# Patient Record
Sex: Female | Born: 1975 | Hispanic: No | Marital: Single | Smoking: Never smoker
Health system: Southern US, Community
[De-identification: ages and names within clinical notes are randomized; demographics above are authoritative.]

## PROBLEM LIST (undated history)

## (undated) DIAGNOSIS — I10 Essential (primary) hypertension: Secondary | ICD-10-CM

## (undated) DIAGNOSIS — E119 Type 2 diabetes mellitus without complications: Secondary | ICD-10-CM

## (undated) HISTORY — PX: NO PAST SURGERIES: SHX2092

## (undated) HISTORY — DX: Type 2 diabetes mellitus without complications: E11.9

## (undated) HISTORY — DX: Essential (primary) hypertension: I10

---

## 2001-11-02 ENCOUNTER — Encounter: Admission: RE | Admit: 2001-11-02 | Discharge: 2001-11-02 | Payer: Self-pay | Admitting: *Deleted

## 2014-10-21 LAB — CYTOLOGY - PAP: PAP SMEAR: NEGATIVE

## 2016-04-04 DIAGNOSIS — E785 Hyperlipidemia, unspecified: Secondary | ICD-10-CM | POA: Insufficient documentation

## 2016-04-04 LAB — OB RESULTS CONSOLE HEPATITIS B SURFACE ANTIGEN: Hepatitis B Surface Ag: NEGATIVE

## 2016-04-04 LAB — OB RESULTS CONSOLE GC/CHLAMYDIA
Chlamydia: NEGATIVE
Gonorrhea: NEGATIVE

## 2016-04-04 LAB — OB RESULTS CONSOLE RPR: RPR: NONREACTIVE

## 2016-04-04 LAB — OB RESULTS CONSOLE VARICELLA ZOSTER ANTIBODY, IGG: VARICELLA IGG: IMMUNE

## 2016-04-04 LAB — OB RESULTS CONSOLE HIV ANTIBODY (ROUTINE TESTING): HIV: NONREACTIVE

## 2016-04-04 LAB — SICKLE CELL SCREEN: Sickle Cell Screen: NORMAL

## 2016-04-04 LAB — OB RESULTS CONSOLE ABO/RH: RH TYPE: POSITIVE

## 2016-04-04 LAB — OB RESULTS CONSOLE ANTIBODY SCREEN: Antibody Screen: NEGATIVE

## 2016-04-04 LAB — OB RESULTS CONSOLE RUBELLA ANTIBODY, IGM: RUBELLA: IMMUNE

## 2016-05-30 NOTE — L&D Delivery Note (Signed)
41 y.o. [id number] at 6072w0d delivered a viable female infant in cephalic, ROA position. No nuchal cord. Left anterior shoulder delivered with ease. 60 sec delayed cord clamping. Cord clamped x2 and cut. Placenta delivered spontaneously intact, with 3VC. Fundus firm on exam with massage and pitocin. Good hemostasis noted.  Anesthesia: Local anesthesia for repair Laceration: 2nd degree perineal Suture: 3-0 Vicryl Good hemostasis noted. EBL: 200cc  Mom and baby recovering in LDR.    Apgars: APGAR (1 MIN): 8   APGAR (5 MINS): 9    Weight: Pending skin to skin  Placenta sent to pathology.   Patient initially came in 4-5 hours previously for labor check, was found to be in active labor and have CHTN with SIPE with severe features 2/2 severe range BPs and elevated creatinine, PC ratio, and AST/ALT. No symptoms of HA/changes in vision/RUQ or epigastric pain. Good urine output during labor/delivery. Continue magnesium for 24 hours postpartum.   Jen MowElizabeth Shaely Gadberry, DO OB Fellow [facility] 10/02/2016, 1:58 AM

## 2016-07-11 ENCOUNTER — Other Ambulatory Visit: Payer: Self-pay

## 2016-07-11 LAB — OB RESULTS CONSOLE HGB/HCT, BLOOD
HCT: 34 %
Hemoglobin: 11.2 g/dL

## 2016-07-11 LAB — OB RESULTS CONSOLE PLATELET COUNT: PLATELETS: 237 10*3/uL

## 2016-07-15 ENCOUNTER — Encounter: Payer: Self-pay | Admitting: *Deleted

## 2016-07-18 ENCOUNTER — Ambulatory Visit: Payer: Self-pay | Admitting: *Deleted

## 2016-07-18 ENCOUNTER — Encounter: Payer: Self-pay | Attending: Obstetrics & Gynecology | Admitting: *Deleted

## 2016-07-18 DIAGNOSIS — O24419 Gestational diabetes mellitus in pregnancy, unspecified control: Secondary | ICD-10-CM

## 2016-07-18 DIAGNOSIS — Z713 Dietary counseling and surveillance: Secondary | ICD-10-CM | POA: Insufficient documentation

## 2016-07-18 DIAGNOSIS — Z6828 Body mass index (BMI) 28.0-28.9, adult: Secondary | ICD-10-CM | POA: Insufficient documentation

## 2016-07-18 DIAGNOSIS — Z3A00 Weeks of gestation of pregnancy not specified: Secondary | ICD-10-CM | POA: Insufficient documentation

## 2016-07-18 NOTE — Progress Notes (Signed)
  Patient was seen on 07/18/2016 for type 2 Diabetes with pregnancy . The following learning objectives were met by the patient : she works in a [employer] full time and walks most of her day. She is on Metformin and Glucotrol, does not state any historyof hypoglycemia yet. She brought her True Trak meter and log book. All FBG entries are below 95 mg/dl and most post meal BG are below 120 with an occasional BG of 135 or so. She states sometimes she checks at 1 hour instead of 2 hours due to work schedule.   States why dietary management is important in controlling blood glucose  Describes the effects of carbohydrates on blood glucose levels  Demonstrates ability to create a balanced meal plan  Demonstrates carbohydrate counting   States when to check blood glucose levels  Demonstrates proper blood glucose monitoring techniques  States the effect of stress and exercise on blood glucose levels  States the importance of limiting caffeine and abstaining from alcohol and smoking  States understanding that Glucotrol   Plan:  Aim for 3 Carb Choices per meal (45 grams) +/- 1 either way  Aim for 1-2 Carbs per snack Begin reading food labels for Total Carbohydrate of foods Continue with your activity level by walking or other activity daily as tolerated Continue checking BG before breakfast and 2 hours after first bite of breakfast, lunch and dinner as directed by MD If you test at 1 hour, make a note on the log book explaining Take medication as directed by MD  Patient already has a meter: True Trak And is testing pre breakfast and 1-2 hours each meal as directed by MD Review of Log Book shows: BG in good control at this time  Patient instructed to monitor glucose levels: FBS: 60 - <90 2 hour: <120 If checking at 1 hour after meal, identify in Log Book  Patient received the following handouts:  Nutrition Diabetes and Pregnancy  Carbohydrate Counting List  Patient will be seen  for follow-up as needed.

## 2016-07-25 ENCOUNTER — Ambulatory Visit (INDEPENDENT_AMBULATORY_CARE_PROVIDER_SITE_OTHER): Payer: Self-pay | Admitting: Obstetrics & Gynecology

## 2016-07-25 ENCOUNTER — Encounter: Payer: Self-pay | Admitting: Obstetrics & Gynecology

## 2016-07-25 DIAGNOSIS — O10013 Pre-existing essential hypertension complicating pregnancy, third trimester: Secondary | ICD-10-CM

## 2016-07-25 DIAGNOSIS — O09521 Supervision of elderly multigravida, first trimester: Secondary | ICD-10-CM | POA: Insufficient documentation

## 2016-07-25 DIAGNOSIS — O169 Unspecified maternal hypertension, unspecified trimester: Secondary | ICD-10-CM | POA: Insufficient documentation

## 2016-07-25 DIAGNOSIS — O09523 Supervision of elderly multigravida, third trimester: Secondary | ICD-10-CM

## 2016-07-25 DIAGNOSIS — Z23 Encounter for immunization: Secondary | ICD-10-CM

## 2016-07-25 DIAGNOSIS — O24113 Pre-existing diabetes mellitus, type 2, in pregnancy, third trimester: Secondary | ICD-10-CM

## 2016-07-25 DIAGNOSIS — O24919 Unspecified diabetes mellitus in pregnancy, unspecified trimester: Secondary | ICD-10-CM | POA: Insufficient documentation

## 2016-07-25 DIAGNOSIS — O0990 Supervision of high risk pregnancy, unspecified, unspecified trimester: Secondary | ICD-10-CM

## 2016-07-25 LAB — POCT URINALYSIS DIP (DEVICE)
BILIRUBIN URINE: NEGATIVE
Glucose, UA: NEGATIVE mg/dL
Hgb urine dipstick: NEGATIVE
Ketones, ur: NEGATIVE mg/dL
Nitrite: NEGATIVE
Protein, ur: NEGATIVE mg/dL
SPECIFIC GRAVITY, URINE: 1.02 (ref 1.005–1.030)
Urobilinogen, UA: 0.2 mg/dL (ref 0.0–1.0)
pH: 6.5 (ref 5.0–8.0)

## 2016-07-25 NOTE — Progress Notes (Signed)
  Subjective:    Sylvia Chase is a H [id number] 5974w1d being seen today for her first obstetrical visit.  Her obstetrical history is significant for A2DM, ghtn, language barrier, ama. Patient does intend to breast feed. Pregnancy history fully reviewed.  Patient reports no complaints.  Vitals:   07/25/16 0800 07/25/16 0800  BP: (!) 107/52   Pulse: 93   Weight: 148 lb 11.2 oz (67.4 kg)   Height:  5\' 1"  (1.549 m)    HISTORY: OB History  Gravida Para Term Preterm AB Living  5 3 3  0 1 3  SAB TAB Ectopic Multiple Live Births  1 0 0 0 3    # Outcome Date GA Lbr Len/2nd Weight Sex Delivery Anes PTL Lv  5 Current           4 SAB 2014          3 Term 01/16/99 6651w0d  7 lb 9 oz (3.43 kg) F Vag-Spont  N LIV  2 Term 10/10/93 4351w0d   F Vag-Spont  N LIV  1 Term 02/28/92 8051w0d  6 lb 9.8 oz (3 kg) M Vag-Spont  N LIV     Past Medical History:  Diagnosis Date  . Diabetes mellitus without complication (HCC)   . Hypertension    Past Surgical History:  Procedure Laterality Date  . NO PAST SURGERIES     Family History  Problem Relation Age of Onset  . Kidney disease Sister      Exam    Uterus:     Pelvic Exam:    Perineum: No Hemorrhoids   Vulva: normal   Vagina:  normal mucosa   pH:    Cervix: anteverted   Adnexa: normal adnexa   Bony Pelvis: android  System: Breast:  normal appearance, no masses or tenderness   Skin: normal coloration and turgor, no rashes    Neurologic: oriented   Extremities: normal strength, tone, and muscle mass   HEENT PERRLA   Mouth/Teeth mucous membranes moist, pharynx normal without lesions   Neck supple   Cardiovascular: regular rate and rhythm   Respiratory:  appears well, vitals normal, no respiratory distress, acyanotic, normal RR, ear and throat exam is normal, neck free of mass or lymphadenopathy, chest clear, no wheezing, crepitations, rhonchi, normal symmetric air entry   Abdomen: soft, non-tender; bowel sounds normal; no masses,  no  organomegaly   Urinary: urethral meatus normal      Assessment:    Pregnancy: [id number] Patient Active Problem List   Diagnosis Date Noted  . Supervision of high risk pregnancy, antepartum 07/25/2016  . Advanced maternal age in multigravida 07/25/2016  . Diabetes mellitus in pregnancy 07/25/2016  . Hypertension in pregnancy 07/25/2016        Plan:     Initial labs drawn. Prenatal vitamins. Problem list reviewed and updated.  Ultrasound discussed; fetal survey: ordered.  Follow up in 2 weeks. TDAP, and 28 week labs today Her recorded sugars are excellent.  Sylvia Chase 07/25/2016

## 2016-07-26 LAB — CBC
HEMOGLOBIN: 12.1 g/dL (ref 11.1–15.9)
Hematocrit: 36.4 % (ref 34.0–46.6)
MCH: 29.3 pg (ref 26.6–33.0)
MCHC: 33.2 g/dL (ref 31.5–35.7)
MCV: 88 fL (ref 79–97)
PLATELETS: 249 10*3/uL (ref 150–379)
RBC: 4.13 x10E6/uL (ref 3.77–5.28)
RDW: 15.7 % — AB (ref 12.3–15.4)
WBC: 11.6 10*3/uL — ABNORMAL HIGH (ref 3.4–10.8)

## 2016-07-26 LAB — RPR: RPR: NONREACTIVE

## 2016-07-26 LAB — HIV ANTIBODY (ROUTINE TESTING W REFLEX): HIV Screen 4th Generation wRfx: NONREACTIVE

## 2016-07-27 ENCOUNTER — Ambulatory Visit (HOSPITAL_COMMUNITY)
Admission: RE | Admit: 2016-07-27 | Discharge: 2016-07-27 | Disposition: A | Discharge status: Home / Self Care | Payer: Self-pay | Source: Ambulatory Visit | Attending: Obstetrics & Gynecology | Admitting: Obstetrics & Gynecology

## 2016-07-27 ENCOUNTER — Encounter (HOSPITAL_COMMUNITY): Payer: Self-pay

## 2016-07-27 ENCOUNTER — Other Ambulatory Visit: Payer: Self-pay | Admitting: Obstetrics & Gynecology

## 2016-07-27 DIAGNOSIS — O09522 Supervision of elderly multigravida, second trimester: Secondary | ICD-10-CM

## 2016-07-27 DIAGNOSIS — O24112 Pre-existing diabetes mellitus, type 2, in pregnancy, second trimester: Secondary | ICD-10-CM | POA: Insufficient documentation

## 2016-07-27 DIAGNOSIS — O24912 Unspecified diabetes mellitus in pregnancy, second trimester: Secondary | ICD-10-CM

## 2016-07-27 DIAGNOSIS — O162 Unspecified maternal hypertension, second trimester: Secondary | ICD-10-CM | POA: Insufficient documentation

## 2016-07-27 DIAGNOSIS — Z363 Encounter for antenatal screening for malformations: Secondary | ICD-10-CM

## 2016-07-27 DIAGNOSIS — Z3A27 27 weeks gestation of pregnancy: Secondary | ICD-10-CM

## 2016-07-27 DIAGNOSIS — O0990 Supervision of high risk pregnancy, unspecified, unspecified trimester: Secondary | ICD-10-CM

## 2016-07-28 ENCOUNTER — Other Ambulatory Visit (HOSPITAL_COMMUNITY): Payer: Self-pay | Admitting: *Deleted

## 2016-07-28 DIAGNOSIS — Z7984 Long term (current) use of oral hypoglycemic drugs: Principal | ICD-10-CM

## 2016-07-28 DIAGNOSIS — O24119 Pre-existing diabetes mellitus, type 2, in pregnancy, unspecified trimester: Secondary | ICD-10-CM

## 2016-08-08 ENCOUNTER — Ambulatory Visit (INDEPENDENT_AMBULATORY_CARE_PROVIDER_SITE_OTHER): Payer: Self-pay | Admitting: Obstetrics and Gynecology

## 2016-08-08 VITALS — BP 128/85 | HR 80 | Wt 151.1 lb

## 2016-08-08 DIAGNOSIS — O24113 Pre-existing diabetes mellitus, type 2, in pregnancy, third trimester: Secondary | ICD-10-CM

## 2016-08-08 DIAGNOSIS — O0990 Supervision of high risk pregnancy, unspecified, unspecified trimester: Secondary | ICD-10-CM

## 2016-08-08 DIAGNOSIS — O09523 Supervision of elderly multigravida, third trimester: Secondary | ICD-10-CM

## 2016-08-08 DIAGNOSIS — O10013 Pre-existing essential hypertension complicating pregnancy, third trimester: Secondary | ICD-10-CM

## 2016-08-08 LAB — POCT URINALYSIS DIP (DEVICE)
Bilirubin Urine: NEGATIVE
Glucose, UA: NEGATIVE mg/dL
HGB URINE DIPSTICK: NEGATIVE
Ketones, ur: NEGATIVE mg/dL
NITRITE: NEGATIVE
Protein, ur: NEGATIVE mg/dL
Specific Gravity, Urine: 1.02 (ref 1.005–1.030)
UROBILINOGEN UA: 0.2 mg/dL (ref 0.0–1.0)
pH: 7 (ref 5.0–8.0)

## 2016-08-08 NOTE — Progress Notes (Signed)
Subjective:  Sylvia Chase is a 41 y.o. [id number] at 45w1dbeing seen today for ongoing prenatal care.  She is currently monitored for the following issues for this high-risk pregnancy and has Supervision of high risk pregnancy, antepartum; Advanced maternal age in multigravida; Diabetes mellitus in pregnancy; and Hypertension in pregnancy on her problem list.  Patient reports no complaints.  Contractions: Not present. Vag. Bleeding: None.  Movement: Present. Denies leaking of fluid.   The following portions of the patient's history were reviewed and updated as appropriate: allergies, current medications, past family history, past medical history, past social history, past surgical history and problem list. Problem list updated.  Objective:   Vitals:   08/08/16 0748  BP: 128/85  Pulse: 80  Weight: 151 lb 1.6 oz (68.5 kg)    Fetal Status: Fetal Heart Rate (bpm): 117   Movement: Present     General:  Alert, oriented and cooperative. Patient is in no acute distress.  Skin: Skin is warm and dry. No rash noted.   Cardiovascular: Normal heart rate noted  Respiratory: Normal respiratory effort, no problems with respiration noted  Abdomen: Soft, gravid, appropriate for gestational age. Pain/Pressure: Present     Pelvic:  Cervical exam deferred        Extremities: Normal range of motion.  Edema: None  Mental Status: Normal mood and affect. Normal behavior. Normal judgment and thought content.   Urinalysis: Urine Protein: Negative Urine Glucose: Negative  Assessment and Plan:  Pregnancy: [id number] 280w1d1. Elderly multigravida in third trimester Started prenatal care to late genetics   2. Supervision of high risk pregnancy, antepartum Stable Continue with PNV  3. Pre-existing type 2 diabetes mellitus during pregnancy in third trimester BS in goal range for the most part a few elevated secondary to diet choices Continue with current management Growth scan completed EFW  78%,  follow scan later this month Fetal ECHO scheduled Start twice weekly testing with next OB visit 4. Pre-existing essential hypertension during pregnancy in third trimester BP stable without meds, continue with BASA - Comp Met (CMET) - Protein / creatinine ratio, urine  Preterm labor symptoms and general obstetric precautions including but not limited to vaginal bleeding, contractions, leaking of fluid and fetal movement were reviewed in detail with the patient. Please refer to After Visit Summary for other counseling recommendations.  Return in about 2 weeks (around 08/22/2016).   MiChancy MilroyMD

## 2016-08-08 NOTE — Patient Instructions (Signed)
Tercer trimestre de embarazo (Third Trimester of Pregnancy) El tercer trimestre comprende desde la semana29 hasta la semana42, es decir, desde el mes7 hasta el mes9. El tercer trimestre es un perodo en el que el feto crece rpidamente. Hacia el final del noveno mes, el feto mide alrededor de 20pulgadas (45cm) de largo y pesa entre 6 y 10 libras (2,700 y 4,500kg). CAMBIOS EN EL ORGANISMO Su organismo atraviesa por muchos cambios durante el embarazo, y estos varan de una mujer a otra.  Seguir aumentando de peso. Es de esperar que aumente entre 25 y 35libras (11 y 16kg) hacia el final del embarazo.  Podrn aparecer las primeras estras en las caderas, el abdomen y las mamas.  Puede tener necesidad de orinar con ms frecuencia porque el feto baja hacia la pelvis y ejerce presin sobre la vejiga.  Debido al embarazo podr sentir acidez estomacal con frecuencia.  Puede estar estreida, ya que ciertas hormonas enlentecen los movimientos de los msculos que empujan los desechos a travs de los intestinos.  Pueden aparecer hemorroides o abultarse e hincharse las venas (venas varicosas).  Puede sentir dolor plvico debido al aumento de peso y a que las hormonas del embarazo relajan las articulaciones entre los huesos de la pelvis. El dolor de espalda puede ser consecuencia de la sobrecarga de los msculos que soportan la postura.  Tal vez haya cambios en el cabello que pueden incluir su engrosamiento, crecimiento rpido y cambios en la textura. Adems, a algunas mujeres se les cae el cabello durante o despus del embarazo, o tienen el cabello seco o fino. Lo ms probable es que el cabello se le normalice despus del nacimiento del beb.  Las mamas seguirn creciendo y le dolern. A veces, puede haber una secrecin amarilla de las mamas llamada calostro.  El ombligo puede salir hacia afuera.  Puede sentir que le falta el aire debido a que se expande el tero.  Puede notar que el feto  "baja" o lo siente ms bajo, en el abdomen.  Puede tener una prdida de secrecin mucosa con sangre. Esto suele ocurrir en el trmino de unos pocos das a una semana antes de que comience el trabajo de parto.  El cuello del tero se vuelve delgado y blando (se borra) cerca de la fecha de parto. QU DEBE ESPERAR EN LOS EXMENES PRENATALES Le harn exmenes prenatales cada 2semanas hasta la semana36. A partir de ese momento le harn exmenes semanales. Durante una visita prenatal de rutina:  La pesarn para asegurarse de que usted y el feto estn creciendo normalmente.  Le tomarn la presin arterial.  Le medirn el abdomen para controlar el desarrollo del beb.  Se escucharn los latidos cardacos fetales.  Se evaluarn los resultados de los estudios solicitados en visitas anteriores.  Le revisarn el cuello del tero cuando est prxima la fecha de parto para controlar si este se ha borrado. Alrededor de la semana36, el mdico le revisar el cuello del tero. Al mismo tiempo, realizar un anlisis de las secreciones del tejido vaginal. Este examen es para determinar si hay un tipo de bacteria, estreptococo Grupo B. El mdico le explicar esto con ms detalle. El mdico puede preguntarle lo siguiente:  Cmo le gustara que fuera el parto.  Cmo se siente.  Si siente los movimientos del beb.  Si ha tenido sntomas anormales, como prdida de lquido, sangrado, dolores de cabeza intensos o clicos abdominales.  Si est consumiendo algn producto que contenga tabaco, como cigarrillos, tabaco de mascar y   cigarrillos electrnicos.  Si tiene alguna pregunta. Otros exmenes o estudios de deteccin que pueden realizarse durante el tercer trimestre incluyen lo siguiente:  Anlisis de sangre para controlar los niveles de hierro (anemia).  Controles fetales para determinar su salud, nivel de actividad y crecimiento. Si tiene alguna enfermedad o hay problemas durante el embarazo, le harn  estudios.  Prueba del VIH (virus de inmunodeficiencia humana). Si corre un riesgo alto, pueden realizarle una prueba de deteccin del VIH durante el tercer trimestre del embarazo. FALSO TRABAJO DE PARTO Es posible que sienta contracciones leves e irregulares que finalmente desaparecen. Se llaman contracciones de Braxton Hicks o falso trabajo de parto. Las contracciones pueden durar horas, das o incluso semanas, antes de que el verdadero trabajo de parto se inicie. Si las contracciones ocurren a intervalos regulares, se intensifican o se hacen dolorosas, lo mejor es que la revise el mdico. SIGNOS DE TRABAJO DE PARTO  Clicos de tipo menstrual.  Contracciones cada 5minutos o menos.  Contracciones que comienzan en la parte superior del tero y se extienden hacia abajo, a la zona inferior del abdomen y la espalda.  Sensacin de mayor presin en la pelvis o dolor de espalda.  Una secrecin de mucosidad acuosa o con sangre que sale de la vagina. Si tiene alguno de estos signos antes de la semana37 del embarazo, llame a su mdico de inmediato. Debe concurrir al hospital para que la controlen inmediatamente. INSTRUCCIONES PARA EL CUIDADO EN EL HOGAR  Evite fumar, consumir hierbas, beber alcohol y tomar frmacos que no le hayan recetado. Estas sustancias qumicas afectan la formacin y el desarrollo del beb.  No consuma ningn producto que contenga tabaco, lo que incluye cigarrillos, tabaco de mascar y cigarrillos electrnicos. Si necesita ayuda para dejar de fumar, consulte al mdico. Puede recibir asesoramiento y otro tipo de recursos para dejar de fumar.  Siga las indicaciones del mdico en relacin con el uso de medicamentos. Durante el embarazo, hay medicamentos que son seguros de tomar y otros que no.  Haga ejercicio solamente como se lo haya indicado el mdico. Sentir clicos uterinos es un buen signo para detener la actividad fsica.  Contine comiendo alimentos sanos con  regularidad.  Use un sostn que le brinde buen soporte si le duelen las mamas.  No se d baos de inmersin en agua caliente, baos turcos ni saunas.  Use el cinturn de seguridad en todo momento mientras conduce.  No coma carne cruda ni queso sin cocinar; evite el contacto con las bandejas sanitarias de los gatos y la tierra que estos animales usan. Estos elementos contienen grmenes que pueden causar defectos congnitos en el beb.  Tome las vitaminas prenatales.  Tome entre 1500 y 2000mg de calcio diariamente comenzando en la semana20 del embarazo hasta el parto.  Si est estreida, pruebe un laxante suave (si el mdico lo autoriza). Consuma ms alimentos ricos en fibra, como vegetales y frutas frescos y cereales integrales. Beba gran cantidad de lquido para mantener la orina de tono claro o color amarillo plido.  Dese baos de asiento con agua tibia para aliviar el dolor o las molestias causadas por las hemorroides. Use una crema para las hemorroides si el mdico la autoriza.  Si tiene venas varicosas, use medias de descanso. Eleve los pies durante 15minutos, 3 o 4veces por da. Limite el consumo de sal en su dieta.  Evite levantar objetos pesados, use zapatos de tacones bajos y mantenga una buena postura.  Descanse con las piernas elevadas si tiene   calambres o dolor de cintura.  Visite a su dentista si no lo ha hecho durante el embarazo. Use un cepillo de dientes blando para higienizarse los dientes y psese el hilo dental con suavidad.  Puede seguir manteniendo relaciones sexuales, a menos que el mdico le indique lo contrario.  No haga viajes largos excepto que sea absolutamente necesario y solo con la autorizacin del mdico.  Tome clases prenatales para entender, practicar y hacer preguntas sobre el trabajo de parto y el parto.  Haga un ensayo de la partida al hospital.  Prepare el bolso que llevar al hospital.  Prepare la habitacin del beb.  Concurra a todas  las visitas prenatales segn las indicaciones de su mdico.  SOLICITE ATENCIN MDICA SI:  No est segura de que est en trabajo de parto o de que ha roto la bolsa de las aguas.  Tiene mareos.  Siente clicos leves, presin en la pelvis o dolor persistente en el abdomen.  Tiene nuseas, vmitos o diarrea persistentes.  Observa una secrecin vaginal con mal olor.  Siente dolor al orinar.  SOLICITE ATENCIN MDICA DE INMEDIATO SI:  Tiene fiebre.  Tiene una prdida de lquido por la vagina.  Tiene sangrado o pequeas prdidas vaginales.  Siente dolor intenso o clicos en el abdomen.  Sube o baja de peso rpidamente.  Tiene dificultad para respirar y siente dolor de pecho.  Sbitamente se le hinchan mucho el rostro, las manos, los tobillos, los pies o las piernas.  No ha sentido los movimientos del beb durante una hora.  Siente un dolor de cabeza intenso que no se alivia con medicamentos.  Su visin se modifica.  Esta informacin no tiene como fin reemplazar el consejo del mdico. Asegrese de hacerle al mdico cualquier pregunta que tenga. Document Released: 02/23/2005 Document Revised: 06/06/2014 Document Reviewed: 07/17/2012 Elsevier Interactive Patient Education  2017 [employer] Inc[employer]  

## 2016-08-09 LAB — COMPREHENSIVE METABOLIC PANEL
ALK PHOS: 68 IU/L (ref 39–117)
ALT: 12 IU/L (ref 0–32)
AST: 15 IU/L (ref 0–40)
Albumin/Globulin Ratio: 1.2 (ref 1.2–2.2)
Albumin: 3.2 g/dL — ABNORMAL LOW (ref 3.5–5.5)
BUN/Creatinine Ratio: 15 (ref 9–23)
BUN: 11 mg/dL (ref 6–24)
CHLORIDE: 102 mmol/L (ref 96–106)
CO2: 17 mmol/L — AB (ref 18–29)
CREATININE: 0.75 mg/dL (ref 0.57–1.00)
Calcium: 9 mg/dL (ref 8.7–10.2)
GFR calc non Af Amer: 100 mL/min/{1.73_m2} (ref >59)
GFR, EST AFRICAN AMERICAN: 115 mL/min/{1.73_m2} (ref >59)
GLUCOSE: 116 mg/dL — AB (ref 65–99)
Globulin, Total: 2.7 g/dL (ref 1.5–4.5)
Potassium: 4.1 mmol/L (ref 3.5–5.2)
Sodium: 137 mmol/L (ref 134–144)
TOTAL PROTEIN: 5.9 g/dL — AB (ref 6.0–8.5)

## 2016-08-09 LAB — PROTEIN / CREATININE RATIO, URINE
CREATININE, UR: 83.2 mg/dL
PROTEIN/CREAT RATIO: 119 mg/g{creat} (ref 0–200)
Protein, Ur: 9.9 mg/dL

## 2016-08-22 ENCOUNTER — Ambulatory Visit (INDEPENDENT_AMBULATORY_CARE_PROVIDER_SITE_OTHER): Payer: Self-pay | Admitting: Obstetrics & Gynecology

## 2016-08-22 VITALS — BP 112/76 | HR 65 | Wt 152.3 lb

## 2016-08-22 DIAGNOSIS — O0990 Supervision of high risk pregnancy, unspecified, unspecified trimester: Secondary | ICD-10-CM

## 2016-08-22 DIAGNOSIS — O24113 Pre-existing diabetes mellitus, type 2, in pregnancy, third trimester: Secondary | ICD-10-CM

## 2016-08-22 DIAGNOSIS — O09523 Supervision of elderly multigravida, third trimester: Secondary | ICD-10-CM

## 2016-08-22 DIAGNOSIS — O10013 Pre-existing essential hypertension complicating pregnancy, third trimester: Secondary | ICD-10-CM

## 2016-08-22 LAB — POCT URINALYSIS DIP (DEVICE)
Bilirubin Urine: NEGATIVE
GLUCOSE, UA: NEGATIVE mg/dL
Hgb urine dipstick: NEGATIVE
Ketones, ur: NEGATIVE mg/dL
NITRITE: NEGATIVE
PROTEIN: 30 mg/dL — AB
Specific Gravity, Urine: 1.025 (ref 1.005–1.030)
UROBILINOGEN UA: 0.2 mg/dL (ref 0.0–1.0)
pH: 6.5 (ref 5.0–8.0)

## 2016-08-22 NOTE — Progress Notes (Signed)
   PRENATAL VISIT NOTE  Subjective:  Sylvia Chase is a 41 y.o. [id number] at 9680w1d being seen today for ongoing prenatal care.  She is currently monitored for the following issues for this high-risk pregnancy and has Supervision of high risk pregnancy, antepartum; Advanced maternal age in multigravida; Diabetes mellitus in pregnancy; and Hypertension in pregnancy on her problem list. Patient is Spanish-speaking only, Spanish interpreter present for this encounter.  Patient reports no complaints.  Contractions: Not present. Vag. Bleeding: None.  Movement: Present. Denies leaking of fluid.   The following portions of the patient's history were reviewed and updated as appropriate: allergies, current medications, past family history, past medical history, past social history, past surgical history and problem list. Problem list updated.  Objective:   Vitals:   08/22/16 0751  BP: 112/76  Pulse: 65  Weight: 152 lb 4.8 oz (69.1 kg)    Fetal Status: Fetal Heart Rate (bpm): 120 Fundal Height: 31 cm Movement: Present     General:  Alert, oriented and cooperative. Patient is in no acute distress.  Skin: Skin is warm and dry. No rash noted.   Cardiovascular: Normal heart rate noted  Respiratory: Normal respiratory effort, no problems with respiration noted  Abdomen: Soft, gravid, appropriate for gestational age. Pain/Pressure: Absent     Pelvic:  Cervical exam deferred        Extremities: Normal range of motion.  Edema: Trace  Mental Status: Normal mood and affect. Normal behavior. Normal judgment and thought content.   Assessment and Plan:  Pregnancy: [id number] at 5780w1d  1. Pre-existing type 2 diabetes mellitus during pregnancy in third trimester Stable BS on Glucotrol and Metformin.  Growth scan this week. Fetal ECHO pending. Will start 2x/week testing next week  2. Pre-existing essential hypertension during pregnancy in third trimester BP stable.  Antenatal testing to start next  week.   3. Elderly multigravida in third trimester Antenatal testing to start next week.   4. Supervision of high risk pregnancy, antepartum Preterm labor symptoms and general obstetric precautions including but not limited to vaginal bleeding, contractions, leaking of fluid and fetal movement were reviewed in detail with the patient. Please refer to After Visit Summary for other counseling recommendations.  Return in about 1 week (around 08/29/2016) for NST, OB Visit (HOB).   Tereso NewcomerUgonna A Socorro Ebron, MD

## 2016-08-22 NOTE — Patient Instructions (Signed)
Regrese a la clinica cuando tenga su cita. Si tiene problemas o preguntas, llama a la clinica o vaya a la sala de emergencia al [facility].    

## 2016-08-24 ENCOUNTER — Ambulatory Visit (HOSPITAL_COMMUNITY)
Admission: RE | Admit: 2016-08-24 | Discharge: 2016-08-24 | Disposition: A | Discharge status: Home / Self Care | Payer: Self-pay | Source: Ambulatory Visit | Attending: Obstetrics & Gynecology | Admitting: Obstetrics & Gynecology

## 2016-08-24 ENCOUNTER — Encounter (HOSPITAL_COMMUNITY): Payer: Self-pay

## 2016-08-24 DIAGNOSIS — Z7984 Long term (current) use of oral hypoglycemic drugs: Secondary | ICD-10-CM

## 2016-08-24 DIAGNOSIS — Z3A31 31 weeks gestation of pregnancy: Secondary | ICD-10-CM | POA: Insufficient documentation

## 2016-08-24 DIAGNOSIS — O09523 Supervision of elderly multigravida, third trimester: Secondary | ICD-10-CM | POA: Insufficient documentation

## 2016-08-24 DIAGNOSIS — O163 Unspecified maternal hypertension, third trimester: Secondary | ICD-10-CM | POA: Insufficient documentation

## 2016-08-24 DIAGNOSIS — O24119 Pre-existing diabetes mellitus, type 2, in pregnancy, unspecified trimester: Secondary | ICD-10-CM

## 2016-08-24 DIAGNOSIS — O24113 Pre-existing diabetes mellitus, type 2, in pregnancy, third trimester: Secondary | ICD-10-CM | POA: Insufficient documentation

## 2016-08-25 ENCOUNTER — Other Ambulatory Visit: Payer: Self-pay | Admitting: Obstetrics & Gynecology

## 2016-08-25 ENCOUNTER — Other Ambulatory Visit (HOSPITAL_COMMUNITY): Payer: Self-pay | Admitting: *Deleted

## 2016-08-25 DIAGNOSIS — Z7984 Long term (current) use of oral hypoglycemic drugs: Principal | ICD-10-CM

## 2016-08-25 DIAGNOSIS — O24319 Unspecified pre-existing diabetes mellitus in pregnancy, unspecified trimester: Secondary | ICD-10-CM

## 2016-08-29 ENCOUNTER — Ambulatory Visit (INDEPENDENT_AMBULATORY_CARE_PROVIDER_SITE_OTHER): Payer: Self-pay | Admitting: Obstetrics and Gynecology

## 2016-08-29 VITALS — BP 119/79 | HR 75 | Wt 155.6 lb

## 2016-08-29 DIAGNOSIS — O24113 Pre-existing diabetes mellitus, type 2, in pregnancy, third trimester: Secondary | ICD-10-CM

## 2016-08-29 DIAGNOSIS — O10013 Pre-existing essential hypertension complicating pregnancy, third trimester: Secondary | ICD-10-CM

## 2016-08-29 DIAGNOSIS — Z789 Other specified health status: Secondary | ICD-10-CM

## 2016-08-29 DIAGNOSIS — Z603 Acculturation difficulty: Secondary | ICD-10-CM | POA: Insufficient documentation

## 2016-08-29 DIAGNOSIS — O0990 Supervision of high risk pregnancy, unspecified, unspecified trimester: Secondary | ICD-10-CM

## 2016-08-29 DIAGNOSIS — O09523 Supervision of elderly multigravida, third trimester: Secondary | ICD-10-CM

## 2016-08-29 NOTE — Progress Notes (Signed)
Prenatal Visit Note Date: 08/29/2016 Clinic: [facility]  Subjective:  Sylvia Chase is a 41 y.o. [phone] at [redacted]w[redacted]d being seen today for ongoing prenatal care.  She is currently monitored for the following issues for this high-risk pregnancy and has Supervision of high risk pregnancy, antepartum; Advanced maternal age in multigravida; Diabetes mellitus in pregnancy; and Hypertension in pregnancy on her problem list.  Patient reports no complaints.   Contractions: Not present. Vag. Bleeding: None.  Movement: Present. Denies leaking of fluid.   The following portions of the patient's history were reviewed and updated as appropriate: allergies, current medications, past family history, past medical history, past social history, past surgical history and problem list. Problem list updated.  Objective:   Vitals:   08/29/16 0747  BP: 119/79  Pulse: 75  Weight: 155 lb 9.6 oz (70.6 kg)    Fetal Status: Fetal Heart Rate (bpm): NST   Movement: Present     General:  Alert, oriented and cooperative. Patient is in no acute distress.  Skin: Skin is warm and dry. No rash noted.   Cardiovascular: Normal heart rate noted  Respiratory: Normal respiratory effort, no problems with respiration noted  Abdomen: Soft, gravid, appropriate for gestational age. Pain/Pressure: Absent     Pelvic:  Cervical exam deferred        Extremities: Normal range of motion.  Edema: Trace  Mental Status: Normal mood and affect. Normal behavior. Normal judgment and thought content.   Urinalysis:      Assessment and Plan:  Pregnancy: [id number] at [redacted]w[redacted]d  1. Pre-existing type 2 diabetes mellitus during pregnancy in third trimester Normal AM fasting//can't take 2hr pp breakfast b/c of work schedule//2hr pp lunch with almost 25% in the 120s-130s//>25% in the 120s-130s with rare 140s Patient currently on glucotrol xl  with lunch and metformin 1000/1000 with lunch and dinner. I told her to do the  glucotrol with breakfast instead of lunch and to do metformin 1500/1500 with lunch and dinner still. D/w her that high chance of needing to do insulin but will follow up in 10d rob. Fetal echo scheduled for later this month. NST reactive 135 baseline, +accels, no decel, mod variability, toco with occasional UCs x 84m) - Fetal nonstress test  2. Supervision of high risk pregnancy, antepartum Routine care. D/w with her re: btl and she's somewhat interested (not sure if pt has coverage). Pt told to sign pw in next few weeks if even interested in it  3. Elderly multigravida in third trimester No change in plan of care  4. Pre-existing essential hypertension during pregnancy in third trimester Continue 2x/week testing. Normal growth scan last week. On no meds currently  Interpreter used  Preterm labor symptoms and general obstetric precautions including but not limited to vaginal bleeding, contractions, leaking of fluid and fetal movement were reviewed in detail with the patient. Please refer to After Visit Summary for other counseling recommendations.  Return for 2x/wk as scheduled.   Leipsic Bing, MD

## 2016-08-29 NOTE — Progress Notes (Signed)
Interpreter Carol Hernandez present for encounter.   

## 2016-09-01 ENCOUNTER — Ambulatory Visit: Payer: Self-pay

## 2016-09-01 ENCOUNTER — Ambulatory Visit (INDEPENDENT_AMBULATORY_CARE_PROVIDER_SITE_OTHER): Payer: Self-pay | Admitting: Obstetrics & Gynecology

## 2016-09-01 VITALS — BP 129/77 | HR 65

## 2016-09-01 DIAGNOSIS — O24113 Pre-existing diabetes mellitus, type 2, in pregnancy, third trimester: Secondary | ICD-10-CM

## 2016-09-01 NOTE — Progress Notes (Signed)
NST performed today was reviewed and was found to be reactive.  AFI was also normal.  Continue recommended antenatal testing and prenatal care. 

## 2016-09-01 NOTE — Progress Notes (Signed)
Pt informed that the ultrasound is considered a limited OB ultrasound and is not intended to be a complete ultrasound exam.  Patient also informed that the ultrasound is not being completed with the intent of assessing for fetal or placental anomalies or any pelvic abnormalities.  Explained that the purpose of today's ultrasound is to assess for presentation and amniotic fluid volume.  Patient acknowledges the purpose of the exam and the limitations of the study.    

## 2016-09-05 ENCOUNTER — Ambulatory Visit (INDEPENDENT_AMBULATORY_CARE_PROVIDER_SITE_OTHER): Payer: Self-pay | Admitting: Family Medicine

## 2016-09-05 VITALS — BP 124/81 | HR 71 | Wt 153.4 lb

## 2016-09-05 DIAGNOSIS — O0990 Supervision of high risk pregnancy, unspecified, unspecified trimester: Secondary | ICD-10-CM

## 2016-09-05 DIAGNOSIS — O10013 Pre-existing essential hypertension complicating pregnancy, third trimester: Secondary | ICD-10-CM

## 2016-09-05 DIAGNOSIS — O24113 Pre-existing diabetes mellitus, type 2, in pregnancy, third trimester: Secondary | ICD-10-CM

## 2016-09-05 DIAGNOSIS — O09523 Supervision of elderly multigravida, third trimester: Secondary | ICD-10-CM

## 2016-09-05 LAB — POCT URINALYSIS DIP (DEVICE)
BILIRUBIN URINE: NEGATIVE
Glucose, UA: NEGATIVE mg/dL
KETONES UR: NEGATIVE mg/dL
NITRITE: NEGATIVE
PH: 6.5 (ref 5.0–8.0)
Protein, ur: 30 mg/dL — AB
Specific Gravity, Urine: 1.02 (ref 1.005–1.030)
Urobilinogen, UA: 0.2 mg/dL (ref 0.0–1.0)

## 2016-09-05 NOTE — Progress Notes (Signed)
FBS 66-92 2 hr pp 74-143 (13 of 32 are out of range-none since she changed the dosing of her meds.) NST reviewed and reactive.   PRENATAL VISIT NOTE  Subjective:  Sylvia Chase is a 41 y.o. [id number] at [redacted]w[redacted]d being seen today for ongoing prenatal care.  She is currently monitored for the following issues for this low-risk pregnancy and has Supervision of high risk pregnancy, antepartum; Advanced maternal age in multigravida; Diabetes mellitus in pregnancy; Hypertension in pregnancy; and Language barrier on her problem list.  Patient reports no complaints.  Contractions: Not present. Vag. Bleeding: None.  Movement: Present. Denies leaking of fluid.   The following portions of the patient's history were reviewed and updated as appropriate: allergies, current medications, past family history, past medical history, past social history, past surgical history and problem list. Problem list updated.  Objective:   Vitals:   09/05/16 0751  BP: 124/81  Pulse: 71  Weight: 153 lb 6.4 oz (69.6 kg)    Fetal Status: Fetal Heart Rate (bpm): NST   Movement: Present     General:  Alert, oriented and cooperative. Patient is in no acute distress.  Skin: Skin is warm and dry. No rash noted.   Cardiovascular: Normal heart rate noted  Respiratory: Normal respiratory effort, no problems with respiration noted  Abdomen: Soft, gravid, appropriate for gestational age. Pain/Pressure: Absent     Pelvic:  Cervical exam deferred        Extremities: Normal range of motion.  Edema: Trace  Mental Status: Normal mood and affect. Normal behavior. Normal judgment and thought content.  FBS 66-92 2 hr pp 74-143 (13 of 32 are out of range-none since she changed the dosing of her meds.) NST reviewed and reactive. Assessment and Plan:  Pregnancy: [id number] at [redacted]w[redacted]d  1. Pre-existing type 2 diabetes mellitus during pregnancy in third trimester CBGs improved - Fetal nonstress test - [place] MFM OB LIMITED; Future  2.  Pre-existing essential hypertension during pregnancy in third trimester BP is well controlled - Fetal nonstress test - [place] MFM OB LIMITED; Future  3. Supervision of high risk pregnancy, antepartum - Fetal nonstress test  4. Elderly multigravida in third trimester    Preterm labor symptoms and general obstetric precautions including but not limited to vaginal bleeding, contractions, leaking of fluid and fetal movement were reviewed in detail with the patient. Please refer to After Visit Summary for other counseling recommendations.  Return in 1 week (on 09/12/2016) for NST only in 3-4 days, OB visit and NST, HRC.   Reva Bores, MD

## 2016-09-05 NOTE — Patient Instructions (Signed)
Tercer trimestre de embarazo (Third Trimester of Pregnancy) El tercer trimestre comprende desde la semana29 hasta la semana42, es decir, desde el mes7 hasta el mes9. El tercer trimestre es un perodo en el que el feto crece rpidamente. Hacia el final del noveno mes, el feto mide alrededor de 20pulgadas (45cm) de largo y pesa entre 6 y 10 libras (2,700 y 4,500kg). CAMBIOS EN EL ORGANISMO Su organismo atraviesa por muchos cambios durante el embarazo, y estos varan de una mujer a otra.  Seguir aumentando de peso. Es de esperar que aumente entre 25 y 35libras (11 y 16kg) hacia el final del embarazo.  Podrn aparecer las primeras estras en las caderas, el abdomen y las mamas.  Puede tener necesidad de orinar con ms frecuencia porque el feto baja hacia la pelvis y ejerce presin sobre la vejiga.  Debido al embarazo podr sentir acidez estomacal con frecuencia.  Puede estar estreida, ya que ciertas hormonas enlentecen los movimientos de los msculos que empujan los desechos a travs de los intestinos.  Pueden aparecer hemorroides o abultarse e hincharse las venas (venas varicosas).  Puede sentir dolor plvico debido al aumento de peso y a que las hormonas del embarazo relajan las articulaciones entre los huesos de la pelvis. El dolor de espalda puede ser consecuencia de la sobrecarga de los msculos que soportan la postura.  Tal vez haya cambios en el cabello que pueden incluir su engrosamiento, crecimiento rpido y cambios en la textura. Adems, a algunas mujeres se les cae el cabello durante o despus del embarazo, o tienen el cabello seco o fino. Lo ms probable es que el cabello se le normalice despus del nacimiento del beb.  Las mamas seguirn creciendo y le dolern. A veces, puede haber una secrecin amarilla de las mamas llamada calostro.  El ombligo puede salir hacia afuera.  Puede sentir que le falta el aire debido a que se expande el tero.  Puede notar que el feto  "baja" o lo siente ms bajo, en el abdomen.  Puede tener una prdida de secrecin mucosa con sangre. Esto suele ocurrir en el trmino de unos pocos das a una semana antes de que comience el trabajo de parto.  El cuello del tero se vuelve delgado y blando (se borra) cerca de la fecha de parto. QU DEBE ESPERAR EN LOS EXMENES PRENATALES Le harn exmenes prenatales cada 2semanas hasta la semana36. A partir de ese momento le harn exmenes semanales. Durante una visita prenatal de rutina:  La pesarn para asegurarse de que usted y el feto estn creciendo normalmente.  Le tomarn la presin arterial.  Le medirn el abdomen para controlar el desarrollo del beb.  Se escucharn los latidos cardacos fetales.  Se evaluarn los resultados de los estudios solicitados en visitas anteriores.  Le revisarn el cuello del tero cuando est prxima la fecha de parto para controlar si este se ha borrado. Alrededor de la semana36, el mdico le revisar el cuello del tero. Al mismo tiempo, realizar un anlisis de las secreciones del tejido vaginal. Este examen es para determinar si hay un tipo de bacteria, estreptococo Grupo B. El mdico le explicar esto con ms detalle. El mdico puede preguntarle lo siguiente:  Cmo le gustara que fuera el parto.  Cmo se siente.  Si siente los movimientos del beb.  Si ha tenido sntomas anormales, como prdida de lquido, sangrado, dolores de cabeza intensos o clicos abdominales.  Si est consumiendo algn producto que contenga tabaco, como cigarrillos, tabaco de mascar y   cigarrillos electrnicos.  Si tiene alguna pregunta. Otros exmenes o estudios de deteccin que pueden realizarse durante el tercer trimestre incluyen lo siguiente:  Anlisis de sangre para controlar los niveles de hierro (anemia).  Controles fetales para determinar su salud, nivel de actividad y crecimiento. Si tiene alguna enfermedad o hay problemas durante el embarazo, le harn  estudios.  Prueba del VIH (virus de inmunodeficiencia humana). Si corre un riesgo alto, pueden realizarle una prueba de deteccin del VIH durante el tercer trimestre del embarazo. FALSO TRABAJO DE PARTO Es posible que sienta contracciones leves e irregulares que finalmente desaparecen. Se llaman contracciones de Braxton Hicks o falso trabajo de parto. Las contracciones pueden durar horas, das o incluso semanas, antes de que el verdadero trabajo de parto se inicie. Si las contracciones ocurren a intervalos regulares, se intensifican o se hacen dolorosas, lo mejor es que la revise el mdico. SIGNOS DE TRABAJO DE PARTO  Clicos de tipo menstrual.  Contracciones cada 5minutos o menos.  Contracciones que comienzan en la parte superior del tero y se extienden hacia abajo, a la zona inferior del abdomen y la espalda.  Sensacin de mayor presin en la pelvis o dolor de espalda.  Una secrecin de mucosidad acuosa o con sangre que sale de la vagina. Si tiene alguno de estos signos antes de la semana37 del embarazo, llame a su mdico de inmediato. Debe concurrir al hospital para que la controlen inmediatamente. INSTRUCCIONES PARA EL CUIDADO EN EL HOGAR  Evite fumar, consumir hierbas, beber alcohol y tomar frmacos que no le hayan recetado. Estas sustancias qumicas afectan la formacin y el desarrollo del beb.  No consuma ningn producto que contenga tabaco, lo que incluye cigarrillos, tabaco de mascar y cigarrillos electrnicos. Si necesita ayuda para dejar de fumar, consulte al mdico. Puede recibir asesoramiento y otro tipo de recursos para dejar de fumar.  Siga las indicaciones del mdico en relacin con el uso de medicamentos. Durante el embarazo, hay medicamentos que son seguros de tomar y otros que no.  Haga ejercicio solamente como se lo haya indicado el mdico. Sentir clicos uterinos es un buen signo para detener la actividad fsica.  Contine comiendo alimentos sanos con  regularidad.  Use un sostn que le brinde buen soporte si le duelen las mamas.  No se d baos de inmersin en agua caliente, baos turcos ni saunas.  Use el cinturn de seguridad en todo momento mientras conduce.  No coma carne cruda ni queso sin cocinar; evite el contacto con las bandejas sanitarias de los gatos y la tierra que estos animales usan. Estos elementos contienen grmenes que pueden causar defectos congnitos en el beb.  Tome las vitaminas prenatales.  Tome entre 1500 y 2000mg de calcio diariamente comenzando en la semana20 del embarazo hasta el parto.  Si est estreida, pruebe un laxante suave (si el mdico lo autoriza). Consuma ms alimentos ricos en fibra, como vegetales y frutas frescos y cereales integrales. Beba gran cantidad de lquido para mantener la orina de tono claro o color amarillo plido.  Dese baos de asiento con agua tibia para aliviar el dolor o las molestias causadas por las hemorroides. Use una crema para las hemorroides si el mdico la autoriza.  Si tiene venas varicosas, use medias de descanso. Eleve los pies durante 15minutos, 3 o 4veces por da. Limite el consumo de sal en su dieta.  Evite levantar objetos pesados, use zapatos de tacones bajos y mantenga una buena postura.  Descanse con las piernas elevadas si tiene   calambres o dolor de cintura.  Visite a su dentista si no lo ha hecho durante el embarazo. Use un cepillo de dientes blando para higienizarse los dientes y psese el hilo dental con suavidad.  Puede seguir manteniendo relaciones sexuales, a menos que el mdico le indique lo contrario.  No haga viajes largos excepto que sea absolutamente necesario y solo con la autorizacin del mdico.  Tome clases prenatales para entender, practicar y hacer preguntas sobre el trabajo de parto y el parto.  Haga un ensayo de la partida al hospital.  Prepare el bolso que llevar al hospital.  Prepare la habitacin del beb.  Concurra a todas  las visitas prenatales segn las indicaciones de su mdico.  SOLICITE ATENCIN MDICA SI:  No est segura de que est en trabajo de parto o de que ha roto la bolsa de las aguas.  Tiene mareos.  Siente clicos leves, presin en la pelvis o dolor persistente en el abdomen.  Tiene nuseas, vmitos o diarrea persistentes.  Observa una secrecin vaginal con mal olor.  Siente dolor al orinar.  SOLICITE ATENCIN MDICA DE INMEDIATO SI:  Tiene fiebre.  Tiene una prdida de lquido por la vagina.  Tiene sangrado o pequeas prdidas vaginales.  Siente dolor intenso o clicos en el abdomen.  Sube o baja de peso rpidamente.  Tiene dificultad para respirar y siente dolor de pecho.  Sbitamente se le hinchan mucho el rostro, las manos, los tobillos, los pies o las piernas.  No ha sentido los movimientos del beb durante una hora.  Siente un dolor de cabeza intenso que no se alivia con medicamentos.  Su visin se modifica.  Esta informacin no tiene como fin reemplazar el consejo del mdico. Asegrese de hacerle al mdico cualquier pregunta que tenga. Document Released: 02/23/2005 Document Revised: 06/06/2014 Document Reviewed: 07/17/2012 Elsevier Interactive Patient Education  2017 [employer]   Lactancia materna (Breastfeeding) Decidir amamantar es una de las mejores elecciones que puede hacer por usted y su beb. El cambio hormonal durante el embarazo produce el desarrollo del tejido mamario y aumenta la cantidad y el tamao de los conductos galactforos. Estas hormonas tambin permiten que las protenas, los azcares y las grasas de la sangre produzcan la leche materna en las glndulas productoras de leche. Las hormonas impiden que la leche materna sea liberada antes del nacimiento del beb, adems de impulsar el flujo de leche luego del nacimiento. Una vez que ha comenzado a amamantar, pensar en el beb, as como la succin o el llanto, pueden estimular la liberacin de leche  de las glndulas productoras de leche. LOS BENEFICIOS DE AMAMANTAR Para el beb  La primera leche (calostro) ayuda a mejorar el funcionamiento del sistema digestivo del beb.  La leche tiene anticuerpos que ayudan a prevenir las infecciones en el beb.  El beb tiene una menor incidencia de asma, alergias y del sndrome de muerte sbita del lactante.  Los nutrientes en la leche materna son mejores para el beb que la leche maternizada y estn preparados exclusivamente para cubrir las necesidades del beb.  La leche materna mejora el desarrollo cerebral del beb.  Es menos probable que el beb desarrolle otras enfermedades, como obesidad infantil, asma o diabetes mellitus de tipo 2. Para usted  La lactancia materna favorece el desarrollo de un vnculo muy especial entre la madre y el beb.  Es conveniente. La leche materna siempre est disponible a la temperatura correcta y es econmica.  La lactancia materna ayuda a quemar caloras y   a perder el peso ganado durante el embarazo.  Favorece la contraccin del tero al tamao que tena antes del embarazo de manera ms rpida y disminuye el sangrado (loquios) despus del parto.  La lactancia materna contribuye a reducir el riesgo de desarrollar diabetes mellitus de tipo 2, osteoporosis o cncer de mama o de ovario en el futuro. SIGNOS DE QUE EL BEB EST HAMBRIENTO Primeros signos de hambre  Aumenta su estado de alerta o actividad.  Se estira.  Mueve la cabeza de un lado a otro.  Mueve la cabeza y abre la boca cuando se le toca la mejilla o la comisura de la boca (reflejo de bsqueda).  Aumenta las vocalizaciones, tales como sonidos de succin, se relame los labios, emite arrullos, suspiros, o chirridos.  Mueve la mano hacia la boca.  Se chupa con ganas los dedos o las manos. Signos tardos de hambre  Est agitado.  Llora de manera intermitente. Signos de hambre extrema Los signos de hambre extrema requerirn que lo calme y  lo consuele antes de que el beb pueda alimentarse adecuadamente. No espere a que se manifiesten los siguientes signos de hambre extrema para comenzar a amamantar:  Agitacin.  Llanto intenso y fuerte.  Gritos. INFORMACIN BSICA SOBRE LA LACTANCIA MATERNA Iniciacin de la lactancia materna  Encuentre un lugar cmodo para sentarse o acostarse, con un buen respaldo para el cuello y la espalda.  Coloque una almohada o una manta enrollada debajo del beb para acomodarlo a la altura de la mama (si est sentada). Las almohadas para amamantar se han diseado especialmente a fin de servir de apoyo para los brazos y el beb mientras amamanta.  Asegrese de que el abdomen del beb est frente al suyo.  Masajee suavemente la mama. Con las yemas de los dedos, masajee la pared del pecho hacia el pezn en un movimiento circular. Esto estimula el flujo de leche. Es posible que deba continuar este movimiento mientras amamanta si la leche fluye lentamente.  Sostenga la mama con el pulgar por arriba del pezn y los otros 4 dedos por debajo de la mama. Asegrese de que los dedos se encuentren lejos del pezn y de la boca del beb.  Empuje suavemente los labios del beb con el pezn o con el dedo.  Cuando la boca del beb se abra lo suficiente, acrquelo rpidamente a la mama e introduzca todo el pezn y la zona oscura que lo rodea (areola), tanto como sea posible, dentro de la boca del beb. ? Debe haber ms areola visible por arriba del labio superior del beb que por debajo del labio inferior. ? La lengua del beb debe estar entre la enca inferior y la mama.  Asegrese de que la boca del beb est en la posicin correcta alrededor del pezn (prendida). Los labios del beb deben crear un sello sobre la mama y estar doblados hacia afuera (invertidos).  Es comn que el beb succione durante 2 a 3 minutos para que comience el flujo de leche materna. Cmo debe prenderse Es muy importante que le ensee al  beb cmo prenderse adecuadamente a la mama. Si el beb no se prende adecuadamente, puede causarle dolor en el pezn y reducir la produccin de leche materna, y hacer que el beb tenga un escaso aumento de peso. Adems, si el beb no se prende adecuadamente al pezn, puede tragar aire durante la alimentacin. Esto puede causarle molestias al beb. Hacer eructar al beb al cambiar de mama puede ayudarlo a liberar el   aire. Sin embargo, ensearle al beb cmo prenderse a la mama adecuadamente es la mejor manera de evitar que se sienta molesto por tragar aire mientras se alimenta. Signos de que el beb se ha prendido adecuadamente al pezn:  Tironea o succiona de modo silencioso, sin causarle dolor.  Se escucha que traga cada 3 o 4 succiones.  Hay movimientos musculares por arriba y por delante de sus odos al succionar. Signos de que el beb no se ha prendido adecuadamente al pezn:  Hace ruidos de succin o de chasquido mientras se alimenta.  Siente dolor en el pezn. Si cree que el beb no se prendi correctamente, deslice el dedo en la comisura de la boca y colquelo entre las encas del beb para interrumpir la succin. Intente comenzar a amamantar nuevamente. Signos de lactancia materna exitosa Signos del beb:  Disminuye gradualmente el nmero de succiones o cesa la succin por completo.  Se duerme.  Relaja el cuerpo.  Retiene una pequea cantidad de leche en la boca.  Se desprende solo del pecho. Signos que presenta usted:  Las mamas han aumentado la firmeza, el peso y el tamao 1 a 3 horas despus de amamantar.  Estn ms blandas inmediatamente despus de amamantar.  Un aumento del volumen de leche, y tambin un cambio en su consistencia y color se producen hacia el quinto da de lactancia materna.  Los pezones no duelen, ni estn agrietados ni sangran. Signos de que su beb recibe la cantidad de leche suficiente  Mojar por lo menos 1 o 2 paales durante las primeras 24  horas despus del nacimiento.  Mojar por lo menos 5 o 6 paales cada 24 horas durante la primera semana despus del nacimiento. La orina debe ser transparente o de color amarillo plido a los 5 das despus del nacimiento.  Mojar entre 6 y 8 paales cada 24 horas a medida que el beb sigue creciendo y desarrollndose.  Defeca al menos 3 veces en 24 horas a los 5 das de vida. La materia fecal debe ser blanda y amarillenta.  Defeca al menos 3 veces en 24 horas a los 7 das de vida. La materia fecal debe ser grumosa y amarillenta.  No registra una prdida de peso mayor del 10% del peso al nacer durante los primeros 3 das de vida.  Aumenta de peso un promedio de 4 a 7onzas (113 a 198g) por semana despus de los 4 das de vida.  Aumenta de peso, diariamente, de manera uniforme a partir de los 5 das de vida, sin registrar prdida de peso despus de las 2semanas de vida. Despus de alimentarse, es posible que el beb regurgite una pequea cantidad. Esto es frecuente. FRECUENCIA Y DURACIN DE LA LACTANCIA MATERNA El amamantamiento frecuente la ayudar a producir ms leche y a prevenir problemas de dolor en los pezones e hinchazn en las mamas. Alimente al beb cuando muestre signos de hambre o si siente la necesidad de reducir la congestin de las mamas. Esto se denomina "lactancia a demanda". Evite el uso del chupete mientras trabaja para establecer la lactancia (las primeras 4 a 6 semanas despus del nacimiento del beb). Despus de este perodo, podr ofrecerle un chupete. Las investigaciones demostraron que el uso del chupete durante el primer ao de vida del beb disminuye el riesgo de desarrollar el sndrome de muerte sbita del lactante (SMSL). Permita que el nio se alimente en cada mama todo lo que desee. Contine amamantando al beb hasta que haya terminado de alimentarse. Cuando   el beb se desprende o se queda dormido mientras se est alimentando de la primera mama, ofrzcale la segunda.  Debido a que, con frecuencia, los recin nacidos permanecen somnolientos las primeras semanas de vida, es posible que deba despertar al beb para alimentarlo. Los horarios de lactancia varan de un beb a otro. Sin embargo, las siguientes reglas pueden servir como gua para ayudarla a garantizar que el beb se alimenta adecuadamente:  Se puede amamantar a los recin nacidos (bebs de 4 semanas o menos de vida) cada 1 a 3 horas.  No deben transcurrir ms de 3 horas durante el da o 5 horas durante la noche sin que se amamante a los recin nacidos.  Debe amamantar al beb 8 veces como mnimo en un perodo de 24 horas, hasta que comience a introducir slidos en su dieta, a los 6 meses de vida aproximadamente. EXTRACCIN DE LECHE MATERNA La extraccin y el almacenamiento de la leche materna le permiten asegurarse de que el beb se alimente exclusivamente de leche materna, aun en momentos en los que no puede amamantar. Esto tiene especial importancia si debe regresar al trabajo en el perodo en que an est amamantando o si no puede estar presente en los momentos en que el beb debe alimentarse. Su asesor en lactancia puede orientarla sobre cunto tiempo es seguro almacenar leche materna. El sacaleche es un aparato que le permite extraer leche de la mama a un recipiente estril. Luego, la leche materna extrada puede almacenarse en un refrigerador o congelador. Algunos sacaleches son manuales, mientras que otros son elctricos. Consulte a su asesor en lactancia qu tipo ser ms conveniente para usted. Los sacaleches se pueden comprar; sin embargo, algunos hospitales y grupos de apoyo a la lactancia materna alquilan sacaleches mensualmente. Un asesor en lactancia puede ensearle cmo extraer leche materna manualmente, en caso de que prefiera no usar un sacaleche. CMO CUIDAR LAS MAMAS DURANTE LA LACTANCIA MATERNA Los pezones se secan, agrietan y duelen durante la lactancia materna. Las siguientes  recomendaciones pueden ayudarla a mantener las mamas humectadas y sanas:  Evite usar jabn en los pezones.  Use un sostn de soporte. Aunque no son esenciales, las camisetas sin mangas o los sostenes especiales para amamantar estn diseados para acceder fcilmente a las mamas, para amamantar sin tener que quitarse todo el sostn o la camiseta. Evite usar sostenes con aro o sostenes muy ajustados.  Seque al aire sus pezones durante 3 a 4minutos despus de amamantar al beb.  Utilice solo apsitos de algodn en el sostn para absorber las prdidas de leche. La prdida de un poco de leche materna entre las tomas es normal.  Utilice lanolina sobre los pezones luego de amamantar. La lanolina ayuda a mantener la humedad normal de la piel. Si usa lanolina pura, no tiene que lavarse los pezones antes de volver a alimentar al beb. La lanolina pura no es txica para el beb. Adems, puede extraer manualmente algunas gotas de leche materna y masajear suavemente esa leche sobre los pezones, para que la leche se seque al aire. Durante las primeras semanas despus de dar a luz, algunas mujeres pueden experimentar hinchazn en las mamas (congestin mamaria). La congestin puede hacer que sienta las mamas pesadas, calientes y sensibles al tacto. El pico de la congestin ocurre dentro de los 3 a 5 das despus del parto. Las siguientes recomendaciones pueden ayudarla a aliviar la congestin:  Vace por completo las mamas al amamantar o extraer leche. Puede aplicar calor hmedo en las mamas (  en la ducha o con toallas hmedas para manos) antes de amamantar o extraer leche. Esto aumenta la circulacin y ayuda a que la leche fluya. Si el beb no vaca por completo las mamas cuando lo amamanta, extraiga la leche restante despus de que haya finalizado.  Use un sostn ajustado (para amamantar o comn) o una camiseta sin mangas durante 1 o 2 das para indicar al cuerpo que disminuya ligeramente la produccin de  leche.  Aplique compresas de hielo sobre las mamas, a menos que le resulte demasiado incmodo.  Asegrese de que el beb est prendido y se encuentre en la posicin correcta mientras lo alimenta. Si la congestin persiste luego de 48 horas o despus de seguir estas recomendaciones, comunquese con su mdico o un asesor en lactancia. RECOMENDACIONES GENERALES PARA EL CUIDADO DE LA SALUD DURANTE LA LACTANCIA MATERNA  Consuma alimentos saludables. Alterne comidas y colaciones, y coma 3 de cada una por da. Dado que lo que come afecta la leche materna, es posible que algunas comidas hagan que su beb se vuelva ms irritable de lo habitual. Evite comer este tipo de alimentos si percibe que afectan de manera negativa al beb.  Beba leche, jugos de fruta y agua para satisfacer su sed (aproximadamente 10 vasos al da).  Descanse con frecuencia, reljese y tome sus vitaminas prenatales para evitar la fatiga, el estrs y la anemia.  Contine con los autocontroles de la mama.  Evite masticar y fumar tabaco. Las sustancias qumicas de los cigarrillos que pasan a la leche materna y la exposicin al humo ambiental del tabaco pueden daar al beb.  No consuma alcohol ni drogas, incluida la marihuana. Algunos medicamentos, que pueden ser perjudiciales para el beb, pueden pasar a travs de la leche materna. Es importante que consulte a su mdico antes de tomar cualquier medicamento, incluidos todos los medicamentos recetados y de venta libre, as como los suplementos vitamnicos y herbales. Puede quedar embarazada durante la lactancia. Si desea controlar la natalidad, consulte a su mdico cules son las opciones ms seguras para el beb. SOLICITE ATENCIN MDICA SI:  Usted siente que quiere dejar de amamantar o se siente frustrada con la lactancia.  Siente dolor en las mamas o en los pezones.  Sus pezones estn agrietados o sangran.  Sus pechos estn irritados, sensibles o calientes.  Tiene un rea  hinchada en cualquiera de las mamas.  Siente escalofros o fiebre.  Tiene nuseas o vmitos.  Presenta una secrecin de otro lquido distinto de la leche materna de los pezones.  Sus mamas no se llenan antes de amamantar al beb para el quinto da despus del parto.  Se siente triste y deprimida.  El beb est demasiado somnoliento como para comer bien.  El beb tiene problemas para dormir.  Moja menos de 3 paales en 24 horas.  Defeca menos de 3 veces en 24 horas.  La piel del beb o la parte blanca de los ojos se vuelven amarillentas.  El beb no ha aumentado de peso a los 5 das de vida.  SOLICITE ATENCIN MDICA DE INMEDIATO SI:  El beb est muy cansado (letargo) y no se quiere despertar para comer.  Le sube la fiebre sin causa.  Esta informacin no tiene como fin reemplazar el consejo del mdico. Asegrese de hacerle al mdico cualquier pregunta que tenga. Document Released: 05/16/2005 Document Revised: 09/07/2015 Document Reviewed: 11/07/2012 Elsevier Interactive Patient Education  2017 [employer]  

## 2016-09-08 ENCOUNTER — Ambulatory Visit: Payer: Self-pay | Admitting: Obstetrics and Gynecology

## 2016-09-08 ENCOUNTER — Ambulatory Visit (HOSPITAL_COMMUNITY)
Admission: RE | Admit: 2016-09-08 | Discharge: 2016-09-08 | Disposition: A | Discharge status: Home / Self Care | Payer: Self-pay | Source: Ambulatory Visit | Attending: Family Medicine | Admitting: Family Medicine

## 2016-09-08 ENCOUNTER — Other Ambulatory Visit: Payer: Self-pay | Admitting: Family Medicine

## 2016-09-08 VITALS — BP 118/80

## 2016-09-08 DIAGNOSIS — Z7984 Long term (current) use of oral hypoglycemic drugs: Secondary | ICD-10-CM | POA: Insufficient documentation

## 2016-09-08 DIAGNOSIS — O10013 Pre-existing essential hypertension complicating pregnancy, third trimester: Secondary | ICD-10-CM

## 2016-09-08 DIAGNOSIS — O24113 Pre-existing diabetes mellitus, type 2, in pregnancy, third trimester: Secondary | ICD-10-CM

## 2016-09-08 DIAGNOSIS — Z3A33 33 weeks gestation of pregnancy: Secondary | ICD-10-CM | POA: Insufficient documentation

## 2016-09-08 DIAGNOSIS — O09523 Supervision of elderly multigravida, third trimester: Secondary | ICD-10-CM | POA: Insufficient documentation

## 2016-09-08 NOTE — Progress Notes (Signed)
4/12 NST reviewed and reactive

## 2016-09-08 NOTE — Progress Notes (Signed)
nst

## 2016-09-12 ENCOUNTER — Ambulatory Visit (INDEPENDENT_AMBULATORY_CARE_PROVIDER_SITE_OTHER): Payer: Self-pay | Admitting: Obstetrics & Gynecology

## 2016-09-12 VITALS — BP 130/83 | HR 70 | Wt 154.1 lb

## 2016-09-12 DIAGNOSIS — O0993 Supervision of high risk pregnancy, unspecified, third trimester: Secondary | ICD-10-CM

## 2016-09-12 DIAGNOSIS — O0990 Supervision of high risk pregnancy, unspecified, unspecified trimester: Secondary | ICD-10-CM

## 2016-09-12 DIAGNOSIS — O10013 Pre-existing essential hypertension complicating pregnancy, third trimester: Secondary | ICD-10-CM

## 2016-09-12 DIAGNOSIS — O24113 Pre-existing diabetes mellitus, type 2, in pregnancy, third trimester: Secondary | ICD-10-CM

## 2016-09-12 LAB — POCT URINALYSIS DIP (DEVICE)
Bilirubin Urine: NEGATIVE
Glucose, UA: NEGATIVE mg/dL
HGB URINE DIPSTICK: NEGATIVE
Ketones, ur: NEGATIVE mg/dL
Nitrite: NEGATIVE
PH: 6 (ref 5.0–8.0)
PROTEIN: NEGATIVE mg/dL
SPECIFIC GRAVITY, URINE: 1.02 (ref 1.005–1.030)
UROBILINOGEN UA: 0.2 mg/dL (ref 0.0–1.0)

## 2016-09-12 NOTE — Progress Notes (Signed)
   PRENATAL VISIT NOTE  Subjective:  Sylvia Chase is a 41 y.o. [id number] at [redacted]w[redacted]d being seen today for ongoing prenatal care.  She is currently monitored for the following issues for this high-risk pregnancy and has Supervision of high risk pregnancy, antepartum; Advanced maternal age in multigravida; Diabetes mellitus in pregnancy; Hypertension in pregnancy; and Language barrier on her problem list.  Patient is Spanish-speaking only, Spanish interpreter present for this encounter.  Patient reports no complaints.  Contractions: Not present. Vag. Bleeding: None.  Movement: Present. Denies leaking of fluid.   The following portions of the patient's history were reviewed and updated as appropriate: allergies, current medications, past family history, past medical history, past social history, past surgical history and problem list. Problem list updated.  Objective:   Vitals:   09/12/16 0748  BP: 130/83  Pulse: 70  Weight: 154 lb 1.6 oz (69.9 kg)    Fetal Status: Fetal Heart Rate (bpm): NST   Movement: Present     General:  Alert, oriented and cooperative. Patient is in no acute distress.  Skin: Skin is warm and dry. No rash noted.   Cardiovascular: Normal heart rate noted  Respiratory: Normal respiratory effort, no problems with respiration noted  Abdomen: Soft, gravid, appropriate for gestational age. Pain/Pressure: Absent     Pelvic:  Cervical exam deferred        Extremities: Normal range of motion.     Mental Status: Normal mood and affect. Normal behavior. Normal judgment and thought content.   CBGs: Within range NST performed today was reviewed and was found to be reactive.    Assessment and Plan:  Pregnancy: [id number] at [redacted]w[redacted]d  1. Pre-existing type 2 diabetes mellitus during pregnancy in third trimester Continue current medications for DM. Continue recommended antenatal testing and prenatal care. Ultrasounds scheduled.  2. Pre-existing essential hypertension during  pregnancy in third trimester Normal BP. Preeclampsia precautions reviewed.  3. Supervision of high risk pregnancy, antepartum Preterm labor symptoms and general obstetric precautions including but not limited to vaginal bleeding, contractions, leaking of fluid and fetal movement were reviewed in detail with the patient. Please refer to After Visit Summary for other counseling recommendations.  Return in about 3 days (around 09/15/2016) for as scheduled.   Tereso Newcomer, MD

## 2016-09-12 NOTE — Patient Instructions (Addendum)
Regrese a la clinica cuando tenga su cita. Si tiene problemas o preguntas, llama a la clinica o vaya a la sala de emergencia al [employer].     Zantac  Pepcid

## 2016-09-15 ENCOUNTER — Ambulatory Visit: Payer: Self-pay

## 2016-09-15 ENCOUNTER — Ambulatory Visit (INDEPENDENT_AMBULATORY_CARE_PROVIDER_SITE_OTHER): Payer: Self-pay | Admitting: Obstetrics & Gynecology

## 2016-09-15 VITALS — BP 137/77 | HR 62

## 2016-09-15 DIAGNOSIS — O24113 Pre-existing diabetes mellitus, type 2, in pregnancy, third trimester: Secondary | ICD-10-CM

## 2016-09-15 DIAGNOSIS — O10013 Pre-existing essential hypertension complicating pregnancy, third trimester: Secondary | ICD-10-CM

## 2016-09-15 NOTE — Progress Notes (Signed)
Pt denies H/A or visual disturbances.  She feels her UC's as mild.

## 2016-09-15 NOTE — Patient Instructions (Signed)
Regrese a la clinica cuando tenga su cita. Si tiene problemas o preguntas, llama a la clinica o vaya a la sala de emergencia al [facility].    

## 2016-09-15 NOTE — Progress Notes (Signed)
NST performed today was reviewed and was found to be reactive.  AFI was also normal.  Continue recommended antenatal testing and prenatal care.  Ugonna A Anyanwu, MD  

## 2016-09-16 LAB — COMPREHENSIVE METABOLIC PANEL
ALT: 35 IU/L — AB (ref 0–32)
AST: 31 IU/L (ref 0–40)
Albumin/Globulin Ratio: 1.2 (ref 1.2–2.2)
Albumin: 3.4 g/dL — ABNORMAL LOW (ref 3.5–5.5)
Alkaline Phosphatase: 185 IU/L — ABNORMAL HIGH (ref 39–117)
BUN/Creatinine Ratio: 16 (ref 9–23)
BUN: 15 mg/dL (ref 6–24)
Bilirubin Total: 0.2 mg/dL (ref 0.0–1.2)
CO2: 18 mmol/L (ref 18–29)
CREATININE: 0.92 mg/dL (ref 0.57–1.00)
Calcium: 9.1 mg/dL (ref 8.7–10.2)
Chloride: 105 mmol/L (ref 96–106)
GFR calc Af Amer: 90 mL/min/{1.73_m2} (ref >59)
GFR calc non Af Amer: 78 mL/min/{1.73_m2} (ref >59)
GLOBULIN, TOTAL: 2.9 g/dL (ref 1.5–4.5)
Glucose: 72 mg/dL (ref 65–99)
Potassium: 4.5 mmol/L (ref 3.5–5.2)
SODIUM: 142 mmol/L (ref 134–144)
Total Protein: 6.3 g/dL (ref 6.0–8.5)

## 2016-09-16 LAB — PROTEIN / CREATININE RATIO, URINE
Creatinine, Urine: 95.3 mg/dL
Protein, Ur: 17.8 mg/dL
Protein/Creat Ratio: 187 mg/g creat (ref 0–200)

## 2016-09-16 LAB — CBC
HEMATOCRIT: 38.8 % (ref 34.0–46.6)
HEMOGLOBIN: 12.9 g/dL (ref 11.1–15.9)
MCH: 29.2 pg (ref 26.6–33.0)
MCHC: 33.2 g/dL (ref 31.5–35.7)
MCV: 88 fL (ref 79–97)
Platelets: 166 10*3/uL (ref 150–379)
RBC: 4.42 x10E6/uL (ref 3.77–5.28)
RDW: 15.6 % — ABNORMAL HIGH (ref 12.3–15.4)
WBC: 7.8 10*3/uL (ref 3.4–10.8)

## 2016-09-19 ENCOUNTER — Ambulatory Visit (INDEPENDENT_AMBULATORY_CARE_PROVIDER_SITE_OTHER): Payer: Self-pay | Admitting: Family Medicine

## 2016-09-19 VITALS — BP 126/73 | HR 65 | Wt 155.8 lb

## 2016-09-19 DIAGNOSIS — O09523 Supervision of elderly multigravida, third trimester: Secondary | ICD-10-CM

## 2016-09-19 DIAGNOSIS — O0993 Supervision of high risk pregnancy, unspecified, third trimester: Secondary | ICD-10-CM

## 2016-09-19 DIAGNOSIS — O10013 Pre-existing essential hypertension complicating pregnancy, third trimester: Secondary | ICD-10-CM

## 2016-09-19 DIAGNOSIS — O0990 Supervision of high risk pregnancy, unspecified, unspecified trimester: Secondary | ICD-10-CM

## 2016-09-19 DIAGNOSIS — O24113 Pre-existing diabetes mellitus, type 2, in pregnancy, third trimester: Secondary | ICD-10-CM

## 2016-09-19 LAB — POCT URINALYSIS DIP (DEVICE)
BILIRUBIN URINE: NEGATIVE
Glucose, UA: NEGATIVE mg/dL
HGB URINE DIPSTICK: NEGATIVE
KETONES UR: NEGATIVE mg/dL
NITRITE: NEGATIVE
PH: 6 (ref 5.0–8.0)
Protein, ur: NEGATIVE mg/dL
SPECIFIC GRAVITY, URINE: 1.02 (ref 1.005–1.030)
Urobilinogen, UA: 0.2 mg/dL (ref 0.0–1.0)

## 2016-09-19 NOTE — Progress Notes (Signed)
Interpreter Sylvia Chase present for encounter.  Pt reports increased episodes of heartburn - Zantac recommended.  [place] for growth scheduled 4/25.

## 2016-09-19 NOTE — Progress Notes (Signed)
    PRENATAL VISIT NOTE Spanish interpreter: Elane Fritz used  Subjective:  Sylvia Chase is a 41 y.o. [phone] at [redacted]w[redacted]d being seen today for ongoing prenatal care.  She is currently monitored for the following issues for this high-risk pregnancy and has Supervision of high risk pregnancy, antepartum; Advanced maternal age in multigravida; Diabetes mellitus in pregnancy; Hypertension in pregnancy; and Language barrier on her problem list.  Patient reports no complaints.  Contractions: Not present. Vag. Bleeding: None.  Movement: Present. Denies leaking of fluid.   The following portions of the patient's history were reviewed and updated as appropriate: allergies, current medications, past family history, past medical history, past social history, past surgical history and problem list. Problem list updated.  Objective:   Vitals:   09/19/16 0759  BP: 126/73  Pulse: 65  Weight: 155 lb 12.8 oz (70.7 kg)    Fetal Status: Fetal Heart Rate (bpm): NST   Movement: Present     General:  Alert, oriented and cooperative. Patient is in no acute distress.  Skin: Skin is warm and dry. No rash noted.   Cardiovascular: Normal heart rate noted  Respiratory: Normal respiratory effort, no problems with respiration noted  Abdomen: Soft, gravid, appropriate for gestational age. Pain/Pressure: Absent     Pelvic:  Cervical exam deferred        Extremities: Normal range of motion.     Mental Status: Normal mood and affect. Normal behavior. Normal judgment and thought content.  FBS 67-87 2 hr pp 74-130 (1 out of range) NST reviewed and reactive. Assessment and Plan:  Pregnancy: [id number] at [redacted]w[redacted]d  1. Pre-existing type 2 diabetes mellitus during pregnancy in third trimester Good BS control. Continue Glipizide and glucophage - Fetal nonstress test  2. Pre-existing essential hypertension during pregnancy in third trimester BP is well controlled on no meds.  Continue ASA  3. Supervision of high  risk pregnancy, antepartum For cultures next week.  4. Elderly multigravida in third trimester In 2x/wk testing - Fetal nonstress test U/s for growth 4/25  Preterm labor symptoms and general obstetric precautions including but not limited to vaginal bleeding, contractions, leaking of fluid and fetal movement were reviewed in detail with the patient. Please refer to After Visit Summary for other counseling recommendations.  Return in about 1 week (around 09/26/2016) for Ob fu and NST; 5/3 NST/AFI, OB visit and NST, HRC, NST only in 3-4 days.   Reva Bores, MD

## 2016-09-19 NOTE — Patient Instructions (Signed)
 Lactancia materna (Breastfeeding) Decidir amamantar es una de las mejores elecciones que puede hacer por usted y su beb. El cambio hormonal durante el embarazo produce el desarrollo del tejido mamario y aumenta la cantidad y el tamao de los conductos galactforos. Estas hormonas tambin permiten que las protenas, los azcares y las grasas de la sangre produzcan la leche materna en las glndulas productoras de leche. Las hormonas impiden que la leche materna sea liberada antes del nacimiento del beb, adems de impulsar el flujo de leche luego del nacimiento. Una vez que ha comenzado a amamantar, pensar en el beb, as como la succin o el llanto, pueden estimular la liberacin de leche de las glndulas productoras de leche. LOS BENEFICIOS DE AMAMANTAR Para el beb  La primera leche (calostro) ayuda a mejorar el funcionamiento del sistema digestivo del beb.  La leche tiene anticuerpos que ayudan a prevenir las infecciones en el beb.  El beb tiene una menor incidencia de asma, alergias y del sndrome de muerte sbita del lactante.  Los nutrientes en la leche materna son mejores para el beb que la leche maternizada y estn preparados exclusivamente para cubrir las necesidades del beb.  La leche materna mejora el desarrollo cerebral del beb.  Es menos probable que el beb desarrolle otras enfermedades, como obesidad infantil, asma o diabetes mellitus de tipo 2. Para usted  La lactancia materna favorece el desarrollo de un vnculo muy especial entre la madre y el beb.  Es conveniente. La leche materna siempre est disponible a la temperatura correcta y es econmica.  La lactancia materna ayuda a quemar caloras y a perder el peso ganado durante el embarazo.  Favorece la contraccin del tero al tamao que tena antes del embarazo de manera ms rpida y disminuye el sangrado (loquios) despus del parto.  La lactancia materna contribuye a reducir el riesgo de desarrollar diabetes  mellitus de tipo 2, osteoporosis o cncer de mama o de ovario en el futuro. SIGNOS DE QUE EL BEB EST HAMBRIENTO Primeros signos de hambre  Aumenta su estado de alerta o actividad.  Se estira.  Mueve la cabeza de un lado a otro.  Mueve la cabeza y abre la boca cuando se le toca la mejilla o la comisura de la boca (reflejo de bsqueda).  Aumenta las vocalizaciones, tales como sonidos de succin, se relame los labios, emite arrullos, suspiros, o chirridos.  Mueve la mano hacia la boca.  Se chupa con ganas los dedos o las manos. Signos tardos de hambre  Est agitado.  Llora de manera intermitente. Signos de hambre extrema Los signos de hambre extrema requerirn que lo calme y lo consuele antes de que el beb pueda alimentarse adecuadamente. No espere a que se manifiesten los siguientes signos de hambre extrema para comenzar a amamantar:  Agitacin.  Llanto intenso y fuerte.  Gritos. INFORMACIN BSICA SOBRE LA LACTANCIA MATERNA Iniciacin de la lactancia materna  Encuentre un lugar cmodo para sentarse o acostarse, con un buen respaldo para el cuello y la espalda.  Coloque una almohada o una manta enrollada debajo del beb para acomodarlo a la altura de la mama (si est sentada). Las almohadas para amamantar se han diseado especialmente a fin de servir de apoyo para los brazos y el beb mientras amamanta.  Asegrese de que el abdomen del beb est frente al suyo.  Masajee suavemente la mama. Con las yemas de los dedos, masajee la pared del pecho hacia el pezn en un movimiento circular. Esto estimula el   flujo de leche. Es posible que deba continuar este movimiento mientras amamanta si la leche fluye lentamente.  Sostenga la mama con el pulgar por arriba del pezn y los otros 4 dedos por debajo de la mama. Asegrese de que los dedos se encuentren lejos del pezn y de la boca del beb.  Empuje suavemente los labios del beb con el pezn o con el dedo.  Cuando la boca del  beb se abra lo suficiente, acrquelo rpidamente a la mama e introduzca todo el pezn y la zona oscura que lo rodea (areola), tanto como sea posible, dentro de la boca del beb. ? Debe haber ms areola visible por arriba del labio superior del beb que por debajo del labio inferior. ? La lengua del beb debe estar entre la enca inferior y la mama.  Asegrese de que la boca del beb est en la posicin correcta alrededor del pezn (prendida). Los labios del beb deben crear un sello sobre la mama y estar doblados hacia afuera (invertidos).  Es comn que el beb succione durante 2 a 3 minutos para que comience el flujo de leche materna. Cmo debe prenderse Es muy importante que le ensee al beb cmo prenderse adecuadamente a la mama. Si el beb no se prende adecuadamente, puede causarle dolor en el pezn y reducir la produccin de leche materna, y hacer que el beb tenga un escaso aumento de peso. Adems, si el beb no se prende adecuadamente al pezn, puede tragar aire durante la alimentacin. Esto puede causarle molestias al beb. Hacer eructar al beb al cambiar de mama puede ayudarlo a liberar el aire. Sin embargo, ensearle al beb cmo prenderse a la mama adecuadamente es la mejor manera de evitar que se sienta molesto por tragar aire mientras se alimenta. Signos de que el beb se ha prendido adecuadamente al pezn:  Tironea o succiona de modo silencioso, sin causarle dolor.  Se escucha que traga cada 3 o 4 succiones.  Hay movimientos musculares por arriba y por delante de sus odos al succionar. Signos de que el beb no se ha prendido adecuadamente al pezn:  Hace ruidos de succin o de chasquido mientras se alimenta.  Siente dolor en el pezn. Si cree que el beb no se prendi correctamente, deslice el dedo en la comisura de la boca y colquelo entre las encas del beb para interrumpir la succin. Intente comenzar a amamantar nuevamente. Signos de lactancia materna exitosa Signos del  beb:  Disminuye gradualmente el nmero de succiones o cesa la succin por completo.  Se duerme.  Relaja el cuerpo.  Retiene una pequea cantidad de leche en la boca.  Se desprende solo del pecho. Signos que presenta usted:  Las mamas han aumentado la firmeza, el peso y el tamao 1 a 3 horas despus de amamantar.  Estn ms blandas inmediatamente despus de amamantar.  Un aumento del volumen de leche, y tambin un cambio en su consistencia y color se producen hacia el quinto da de lactancia materna.  Los pezones no duelen, ni estn agrietados ni sangran. Signos de que su beb recibe la cantidad de leche suficiente  Mojar por lo menos 1 o 2 paales durante las primeras 24 horas despus del nacimiento.  Mojar por lo menos 5 o 6 paales cada 24 horas durante la primera semana despus del nacimiento. La orina debe ser transparente o de color amarillo plido a los 5 das despus del nacimiento.  Mojar entre 6 y 8 paales cada 24 horas a medida   que el beb sigue creciendo y desarrollndose.  Defeca al menos 3 veces en 24 horas a los 5 das de vida. La materia fecal debe ser blanda y amarillenta.  Defeca al menos 3 veces en 24 horas a los 7 das de vida. La materia fecal debe ser grumosa y amarillenta.  No registra una prdida de peso mayor del 10% del peso al nacer durante los primeros 3 das de vida.  Aumenta de peso un promedio de 4 a 7onzas (113 a 198g) por semana despus de los 4 das de vida.  Aumenta de peso, diariamente, de manera uniforme a partir de los 5 das de vida, sin registrar prdida de peso despus de las 2semanas de vida. Despus de alimentarse, es posible que el beb regurgite una pequea cantidad. Esto es frecuente. FRECUENCIA Y DURACIN DE LA LACTANCIA MATERNA El amamantamiento frecuente la ayudar a producir ms leche y a prevenir problemas de dolor en los pezones e hinchazn en las mamas. Alimente al beb cuando muestre signos de hambre o si siente la  necesidad de reducir la congestin de las mamas. Esto se denomina "lactancia a demanda". Evite el uso del chupete mientras trabaja para establecer la lactancia (las primeras 4 a 6 semanas despus del nacimiento del beb). Despus de este perodo, podr ofrecerle un chupete. Las investigaciones demostraron que el uso del chupete durante el primer ao de vida del beb disminuye el riesgo de desarrollar el sndrome de muerte sbita del lactante (SMSL). Permita que el nio se alimente en cada mama todo lo que desee. Contine amamantando al beb hasta que haya terminado de alimentarse. Cuando el beb se desprende o se queda dormido mientras se est alimentando de la primera mama, ofrzcale la segunda. Debido a que, con frecuencia, los recin nacidos permanecen somnolientos las primeras semanas de vida, es posible que deba despertar al beb para alimentarlo. Los horarios de lactancia varan de un beb a otro. Sin embargo, las siguientes reglas pueden servir como gua para ayudarla a garantizar que el beb se alimenta adecuadamente:  Se puede amamantar a los recin nacidos (bebs de 4 semanas o menos de vida) cada 1 a 3 horas.  No deben transcurrir ms de 3 horas durante el da o 5 horas durante la noche sin que se amamante a los recin nacidos.  Debe amamantar al beb 8 veces como mnimo en un perodo de 24 horas, hasta que comience a introducir slidos en su dieta, a los 6 meses de vida aproximadamente. EXTRACCIN DE LECHE MATERNA La extraccin y el almacenamiento de la leche materna le permiten asegurarse de que el beb se alimente exclusivamente de leche materna, aun en momentos en los que no puede amamantar. Esto tiene especial importancia si debe regresar al trabajo en el perodo en que an est amamantando o si no puede estar presente en los momentos en que el beb debe alimentarse. Su asesor en lactancia puede orientarla sobre cunto tiempo es seguro almacenar leche materna. El sacaleche es un aparato  que le permite extraer leche de la mama a un recipiente estril. Luego, la leche materna extrada puede almacenarse en un refrigerador o congelador. Algunos sacaleches son manuales, mientras que otros son elctricos. Consulte a su asesor en lactancia qu tipo ser ms conveniente para usted. Los sacaleches se pueden comprar; sin embargo, algunos hospitales y grupos de apoyo a la lactancia materna alquilan sacaleches mensualmente. Un asesor en lactancia puede ensearle cmo extraer leche materna manualmente, en caso de que prefiera no usar un sacaleche.   CMO CUIDAR LAS MAMAS DURANTE LA LACTANCIA MATERNA Los pezones se secan, agrietan y duelen durante la lactancia materna. Las siguientes recomendaciones pueden ayudarla a mantener las mamas humectadas y sanas:  Evite usar jabn en los pezones.  Use un sostn de soporte. Aunque no son esenciales, las camisetas sin mangas o los sostenes especiales para amamantar estn diseados para acceder fcilmente a las mamas, para amamantar sin tener que quitarse todo el sostn o la camiseta. Evite usar sostenes con aro o sostenes muy ajustados.  Seque al aire sus pezones durante 3 a 4minutos despus de amamantar al beb.  Utilice solo apsitos de algodn en el sostn para absorber las prdidas de leche. La prdida de un poco de leche materna entre las tomas es normal.  Utilice lanolina sobre los pezones luego de amamantar. La lanolina ayuda a mantener la humedad normal de la piel. Si usa lanolina pura, no tiene que lavarse los pezones antes de volver a alimentar al beb. La lanolina pura no es txica para el beb. Adems, puede extraer manualmente algunas gotas de leche materna y masajear suavemente esa leche sobre los pezones, para que la leche se seque al aire. Durante las primeras semanas despus de dar a luz, algunas mujeres pueden experimentar hinchazn en las mamas (congestin mamaria). La congestin puede hacer que sienta las mamas pesadas, calientes y  sensibles al tacto. El pico de la congestin ocurre dentro de los 3 a 5 das despus del parto. Las siguientes recomendaciones pueden ayudarla a aliviar la congestin:  Vace por completo las mamas al amamantar o extraer leche. Puede aplicar calor hmedo en las mamas (en la ducha o con toallas hmedas para manos) antes de amamantar o extraer leche. Esto aumenta la circulacin y ayuda a que la leche fluya. Si el beb no vaca por completo las mamas cuando lo amamanta, extraiga la leche restante despus de que haya finalizado.  Use un sostn ajustado (para amamantar o comn) o una camiseta sin mangas durante 1 o 2 das para indicar al cuerpo que disminuya ligeramente la produccin de leche.  Aplique compresas de hielo sobre las mamas, a menos que le resulte demasiado incmodo.  Asegrese de que el beb est prendido y se encuentre en la posicin correcta mientras lo alimenta. Si la congestin persiste luego de 48 horas o despus de seguir estas recomendaciones, comunquese con su mdico o un asesor en lactancia. RECOMENDACIONES GENERALES PARA EL CUIDADO DE LA SALUD DURANTE LA LACTANCIA MATERNA  Consuma alimentos saludables. Alterne comidas y colaciones, y coma 3 de cada una por da. Dado que lo que come afecta la leche materna, es posible que algunas comidas hagan que su beb se vuelva ms irritable de lo habitual. Evite comer este tipo de alimentos si percibe que afectan de manera negativa al beb.  Beba leche, jugos de fruta y agua para satisfacer su sed (aproximadamente 10 vasos al da).  Descanse con frecuencia, reljese y tome sus vitaminas prenatales para evitar la fatiga, el estrs y la anemia.  Contine con los autocontroles de la mama.  Evite masticar y fumar tabaco. Las sustancias qumicas de los cigarrillos que pasan a la leche materna y la exposicin al humo ambiental del tabaco pueden daar al beb.  No consuma alcohol ni drogas, incluida la marihuana. Algunos medicamentos, que  pueden ser perjudiciales para el beb, pueden pasar a travs de la leche materna. Es importante que consulte a su mdico antes de tomar cualquier medicamento, incluidos todos los medicamentos recetados y de venta   libre, as como los suplementos vitamnicos y herbales. Puede quedar embarazada durante la lactancia. Si desea controlar la natalidad, consulte a su mdico cules son las opciones ms seguras para el beb. SOLICITE ATENCIN MDICA SI:  Usted siente que quiere dejar de amamantar o se siente frustrada con la lactancia.  Siente dolor en las mamas o en los pezones.  Sus pezones estn agrietados o sangran.  Sus pechos estn irritados, sensibles o calientes.  Tiene un rea hinchada en cualquiera de las mamas.  Siente escalofros o fiebre.  Tiene nuseas o vmitos.  Presenta una secrecin de otro lquido distinto de la leche materna de los pezones.  Sus mamas no se llenan antes de amamantar al beb para el quinto da despus del parto.  Se siente triste y deprimida.  El beb est demasiado somnoliento como para comer bien.  El beb tiene problemas para dormir.  Moja menos de 3 paales en 24 horas.  Defeca menos de 3 veces en 24 horas.  La piel del beb o la parte blanca de los ojos se vuelven amarillentas.  El beb no ha aumentado de peso a los 5 das de vida.  SOLICITE ATENCIN MDICA DE INMEDIATO SI:  El beb est muy cansado (letargo) y no se quiere despertar para comer.  Le sube la fiebre sin causa.  Esta informacin no tiene como fin reemplazar el consejo del mdico. Asegrese de hacerle al mdico cualquier pregunta que tenga. Document Released: 05/16/2005 Document Revised: 09/07/2015 Document Reviewed: 11/07/2012 Elsevier Interactive Patient Education  2017 [employer]  

## 2016-09-21 ENCOUNTER — Other Ambulatory Visit: Payer: Self-pay | Admitting: Obstetrics and Gynecology

## 2016-09-21 ENCOUNTER — Ambulatory Visit (HOSPITAL_COMMUNITY)
Admission: RE | Admit: 2016-09-21 | Discharge: 2016-09-21 | Disposition: A | Discharge status: Home / Self Care | Payer: Self-pay | Source: Ambulatory Visit | Attending: Obstetrics & Gynecology | Admitting: Obstetrics & Gynecology

## 2016-09-21 ENCOUNTER — Other Ambulatory Visit: Payer: Self-pay

## 2016-09-21 ENCOUNTER — Encounter (HOSPITAL_COMMUNITY): Payer: Self-pay

## 2016-09-21 DIAGNOSIS — O24313 Unspecified pre-existing diabetes mellitus in pregnancy, third trimester: Secondary | ICD-10-CM

## 2016-09-21 DIAGNOSIS — O0990 Supervision of high risk pregnancy, unspecified, unspecified trimester: Secondary | ICD-10-CM

## 2016-09-21 DIAGNOSIS — Z3A35 35 weeks gestation of pregnancy: Secondary | ICD-10-CM

## 2016-09-21 DIAGNOSIS — O09523 Supervision of elderly multigravida, third trimester: Secondary | ICD-10-CM

## 2016-09-21 DIAGNOSIS — O10013 Pre-existing essential hypertension complicating pregnancy, third trimester: Secondary | ICD-10-CM | POA: Insufficient documentation

## 2016-09-21 DIAGNOSIS — O24319 Unspecified pre-existing diabetes mellitus in pregnancy, unspecified trimester: Secondary | ICD-10-CM

## 2016-09-21 DIAGNOSIS — O24113 Pre-existing diabetes mellitus, type 2, in pregnancy, third trimester: Secondary | ICD-10-CM | POA: Insufficient documentation

## 2016-09-21 DIAGNOSIS — O10919 Unspecified pre-existing hypertension complicating pregnancy, unspecified trimester: Secondary | ICD-10-CM

## 2016-09-21 DIAGNOSIS — Z7984 Long term (current) use of oral hypoglycemic drugs: Secondary | ICD-10-CM

## 2016-09-22 ENCOUNTER — Other Ambulatory Visit: Payer: Self-pay | Admitting: Advanced Practice Midwife

## 2016-09-22 ENCOUNTER — Other Ambulatory Visit (HOSPITAL_COMMUNITY): Payer: Self-pay | Admitting: *Deleted

## 2016-09-22 DIAGNOSIS — Z7984 Long term (current) use of oral hypoglycemic drugs: Principal | ICD-10-CM

## 2016-09-22 DIAGNOSIS — O24119 Pre-existing diabetes mellitus, type 2, in pregnancy, unspecified trimester: Secondary | ICD-10-CM

## 2016-09-23 ENCOUNTER — Other Ambulatory Visit (HOSPITAL_COMMUNITY): Payer: Self-pay | Admitting: *Deleted

## 2016-09-23 DIAGNOSIS — O24119 Pre-existing diabetes mellitus, type 2, in pregnancy, unspecified trimester: Secondary | ICD-10-CM

## 2016-09-26 ENCOUNTER — Ambulatory Visit (INDEPENDENT_AMBULATORY_CARE_PROVIDER_SITE_OTHER): Payer: Self-pay | Admitting: Obstetrics & Gynecology

## 2016-09-26 VITALS — BP 128/84 | HR 63 | Wt 153.2 lb

## 2016-09-26 DIAGNOSIS — L299 Pruritus, unspecified: Secondary | ICD-10-CM

## 2016-09-26 DIAGNOSIS — Z113 Encounter for screening for infections with a predominantly sexual mode of transmission: Secondary | ICD-10-CM

## 2016-09-26 DIAGNOSIS — O10013 Pre-existing essential hypertension complicating pregnancy, third trimester: Secondary | ICD-10-CM

## 2016-09-26 DIAGNOSIS — O0990 Supervision of high risk pregnancy, unspecified, unspecified trimester: Secondary | ICD-10-CM

## 2016-09-26 DIAGNOSIS — O24113 Pre-existing diabetes mellitus, type 2, in pregnancy, third trimester: Secondary | ICD-10-CM

## 2016-09-26 LAB — POCT URINALYSIS DIP (DEVICE)
Bilirubin Urine: NEGATIVE
Glucose, UA: NEGATIVE mg/dL
HGB URINE DIPSTICK: NEGATIVE
Ketones, ur: NEGATIVE mg/dL
Nitrite: NEGATIVE
PROTEIN: 30 mg/dL — AB
SPECIFIC GRAVITY, URINE: 1.015 (ref 1.005–1.030)
UROBILINOGEN UA: 0.2 mg/dL (ref 0.0–1.0)
pH: 5 (ref 5.0–8.0)

## 2016-09-26 NOTE — Addendum Note (Signed)
Addended by: Jill Side on: 09/26/2016 09:30 AM   Modules accepted: Orders

## 2016-09-26 NOTE — Progress Notes (Signed)
Interpreter Maretta Los present for encounter today. Pt reports itching or her arms and bottoms of her feet. She also has swelling of her feet.     PRENATAL VISIT NOTE  Subjective:  Sylvia Chase is a 41 y.o. [id number] at [redacted]w[redacted]d being seen today for ongoing prenatal care.  She is currently monitored for the following issues for this high-risk pregnancy and has Supervision of high risk pregnancy, antepartum; Elderly multigravida in first trimester; Diabetes mellitus in pregnancy; Hypertension in pregnancy; Language barrier; and Hyperlipidemia on her problem list.  Patient reports itching as described above..  Contractions: Irregular. Vag. Bleeding: None.  Movement: Present. Denies leaking of fluid.   The following portions of the patient's history were reviewed and updated as appropriate: allergies, current medications, past family history, past medical history, past social history, past surgical history and problem list. Problem list updated.  Objective:   Vitals:   09/26/16 0751  BP: 128/84  Pulse: 63  Weight: 153 lb 3.2 oz (69.5 kg)    Fetal Status: Fetal Heart Rate (bpm): NST Fundal Height: 34 cm Movement: Present  Presentation: Vertex  General:  Alert, oriented and cooperative. Patient is in no acute distress.  Skin: Skin is warm and dry. No rash noted.   Cardiovascular: Normal heart rate noted  Respiratory: Normal respiratory effort, no problems with respiration noted  Abdomen: Soft, gravid, appropriate for gestational age. Pain/Pressure: Absent     Pelvic:  Cervical exam performed Dilation: 2 Effacement (%): 40 Station: Ballotable  Extremities: Normal range of motion.  Edema: None  Mental Status: Normal mood and affect. Normal behavior. Normal judgment and thought content.   Assessment and Plan:  Pregnancy: [id number] at [redacted]w[redacted]d  1. Pre-existing type 2 diabetes mellitus during pregnancy in third trimester - CBGs are all nml  Is not checking pp breakfast. - Fetal  nonstress test  2. Pre-existing essential hypertension during pregnancy in third trimester -Nml BP today; no signs of pre E; stop baby aspirin  3. Supervision of high risk pregnancy, antepartum - Strep Gp B NAA - GC/Chlamydia probe amp ([place])not at [facility]  4. Itching - Bile acids, total  Term labor symptoms and general obstetric precautions including but not limited to vaginal bleeding, contractions, leaking of fluid and fetal movement were reviewed in detail with the patient. Please refer to After Visit Summary for other counseling recommendations.  Return in about 1 week (around 10/03/2016) for Ob fu and NST.   Lesly Dukes, MD

## 2016-09-27 LAB — GC/CHLAMYDIA PROBE AMP (~~LOC~~) NOT AT ARMC
Chlamydia: NEGATIVE
Neisseria Gonorrhea: NEGATIVE

## 2016-09-28 ENCOUNTER — Ambulatory Visit (HOSPITAL_COMMUNITY): Payer: Self-pay

## 2016-09-28 ENCOUNTER — Encounter (HOSPITAL_COMMUNITY): Payer: Self-pay

## 2016-09-28 LAB — STREP GP B NAA: Strep Gp B NAA: POSITIVE — AB

## 2016-09-28 LAB — BILE ACIDS, TOTAL: Bile Acids Total: 8.3 umol/L (ref 4.7–24.5)

## 2016-09-29 ENCOUNTER — Ambulatory Visit (INDEPENDENT_AMBULATORY_CARE_PROVIDER_SITE_OTHER): Payer: Self-pay | Admitting: Student

## 2016-09-29 ENCOUNTER — Ambulatory Visit: Payer: Self-pay

## 2016-09-29 VITALS — BP 144/81 | HR 62

## 2016-09-29 DIAGNOSIS — O10013 Pre-existing essential hypertension complicating pregnancy, third trimester: Secondary | ICD-10-CM

## 2016-09-29 DIAGNOSIS — O24113 Pre-existing diabetes mellitus, type 2, in pregnancy, third trimester: Secondary | ICD-10-CM

## 2016-09-29 NOTE — Progress Notes (Signed)
[employer] video interpreter [phone]#[id number] Darin Engels( [place]) used for encounter. Pt denies H/A or visual disturbances. Pre-e labs done.  Pt advised to return ro hospital/MA[facility] if she develops H/A, or visual changes. Pt voiced understanding.

## 2016-09-29 NOTE — Progress Notes (Signed)
Reactive NST Elevated BP, asymptomatic. Will collect labs.

## 2016-09-30 ENCOUNTER — Telehealth: Payer: Self-pay | Admitting: Student

## 2016-09-30 LAB — COMP. METABOLIC PANEL (12)
ALBUMIN: 3.4 g/dL — AB (ref 3.5–5.5)
ALK PHOS: 284 IU/L — AB (ref 39–117)
AST: 53 IU/L — ABNORMAL HIGH (ref 0–40)
Albumin/Globulin Ratio: 1.1 — ABNORMAL LOW (ref 1.2–2.2)
BILIRUBIN TOTAL: 0.6 mg/dL (ref 0.0–1.2)
BUN/Creatinine Ratio: 16 (ref 9–23)
BUN: 15 mg/dL (ref 6–24)
Calcium: 8.9 mg/dL (ref 8.7–10.2)
Chloride: 102 mmol/L (ref 96–106)
Creatinine, Ser: 0.94 mg/dL (ref 0.57–1.00)
GFR calc Af Amer: 88 mL/min/{1.73_m2} (ref >59)
GFR, EST NON AFRICAN AMERICAN: 76 mL/min/{1.73_m2} (ref >59)
GLOBULIN, TOTAL: 3 g/dL (ref 1.5–4.5)
Glucose: 64 mg/dL — ABNORMAL LOW (ref 65–99)
POTASSIUM: 4.3 mmol/L (ref 3.5–5.2)
SODIUM: 137 mmol/L (ref 134–144)
Total Protein: 6.4 g/dL (ref 6.0–8.5)

## 2016-09-30 LAB — CBC
Hematocrit: 40.8 % (ref 34.0–46.6)
Hemoglobin: 13.3 g/dL (ref 11.1–15.9)
MCH: 29.8 pg (ref 26.6–33.0)
MCHC: 32.6 g/dL (ref 31.5–35.7)
MCV: 92 fL (ref 79–97)
PLATELETS: 168 10*3/uL (ref 150–379)
RBC: 4.46 x10E6/uL (ref 3.77–5.28)
RDW: 15.4 % (ref 12.3–15.4)
WBC: 7.8 10*3/uL (ref 3.4–10.8)

## 2016-09-30 LAB — PROTEIN / CREATININE RATIO, URINE
Creatinine, Urine: 83.4 mg/dL
Protein, Ur: 28.4 mg/dL
Protein/Creat Ratio: 341 mg/g creat — ABNORMAL HIGH (ref 0–200)

## 2016-09-30 NOTE — Telephone Encounter (Signed)
Called patient using Spanish interpreter. Per Dr. Vergie LivingPickens, patient needs to come to MAU to pick up materials for 24 hour urine collection. Patient has appointment with [facility] on Monday; will return collection at appointment on Monday. Reasons to go to MAU, including s/s preeclampsia, discussed.  Patient verbalized understanding.

## 2016-10-01 ENCOUNTER — Encounter (HOSPITAL_COMMUNITY): Payer: Self-pay | Admitting: Certified Nurse Midwife

## 2016-10-01 ENCOUNTER — Inpatient Hospital Stay (HOSPITAL_COMMUNITY)
Admission: AD | Admit: 2016-10-01 | Discharge: 2016-10-04 | DRG: 774 | Disposition: A | Discharge status: Home / Self Care | Payer: Medicaid Other | Source: Ambulatory Visit | Attending: Obstetrics and Gynecology | Admitting: Obstetrics and Gynecology

## 2016-10-01 DIAGNOSIS — O119 Pre-existing hypertension with pre-eclampsia, unspecified trimester: Secondary | ICD-10-CM | POA: Diagnosis present

## 2016-10-01 DIAGNOSIS — O09521 Supervision of elderly multigravida, first trimester: Secondary | ICD-10-CM | POA: Diagnosis present

## 2016-10-01 DIAGNOSIS — O24919 Unspecified diabetes mellitus in pregnancy, unspecified trimester: Secondary | ICD-10-CM | POA: Diagnosis present

## 2016-10-01 DIAGNOSIS — O99824 Streptococcus B carrier state complicating childbirth: Secondary | ICD-10-CM | POA: Diagnosis present

## 2016-10-01 DIAGNOSIS — Z3A36 36 weeks gestation of pregnancy: Secondary | ICD-10-CM

## 2016-10-01 DIAGNOSIS — O1414 Severe pre-eclampsia complicating childbirth: Secondary | ICD-10-CM | POA: Diagnosis not present

## 2016-10-01 DIAGNOSIS — O1002 Pre-existing essential hypertension complicating childbirth: Secondary | ICD-10-CM | POA: Diagnosis present

## 2016-10-01 DIAGNOSIS — Z7984 Long term (current) use of oral hypoglycemic drugs: Secondary | ICD-10-CM

## 2016-10-01 DIAGNOSIS — O114 Pre-existing hypertension with pre-eclampsia, complicating childbirth: Secondary | ICD-10-CM | POA: Diagnosis present

## 2016-10-01 DIAGNOSIS — Z7982 Long term (current) use of aspirin: Secondary | ICD-10-CM | POA: Diagnosis not present

## 2016-10-01 DIAGNOSIS — O24425 Gestational diabetes mellitus in childbirth, controlled by oral hypoglycemic drugs: Secondary | ICD-10-CM | POA: Diagnosis present

## 2016-10-01 DIAGNOSIS — O10013 Pre-existing essential hypertension complicating pregnancy, third trimester: Secondary | ICD-10-CM

## 2016-10-01 DIAGNOSIS — O24113 Pre-existing diabetes mellitus, type 2, in pregnancy, third trimester: Secondary | ICD-10-CM

## 2016-10-01 DIAGNOSIS — O0990 Supervision of high risk pregnancy, unspecified, unspecified trimester: Secondary | ICD-10-CM

## 2016-10-01 DIAGNOSIS — Z79899 Other long term (current) drug therapy: Secondary | ICD-10-CM

## 2016-10-01 LAB — URINALYSIS, ROUTINE W REFLEX MICROSCOPIC
Bilirubin Urine: NEGATIVE
Glucose, UA: NEGATIVE mg/dL
KETONES UR: NEGATIVE mg/dL
Nitrite: NEGATIVE
PROTEIN: 100 mg/dL — AB
Specific Gravity, Urine: 1.016 (ref 1.005–1.030)
pH: 6 (ref 5.0–8.0)

## 2016-10-01 LAB — CBC
HEMATOCRIT: 38.2 % (ref 36.0–46.0)
Hemoglobin: 13.1 g/dL (ref 12.0–15.0)
MCH: 30.3 pg (ref 26.0–34.0)
MCHC: 34.3 g/dL (ref 30.0–36.0)
MCV: 88.4 fL (ref 78.0–100.0)
Platelets: 164 10*3/uL (ref 150–400)
RBC: 4.32 MIL/uL (ref 3.87–5.11)
RDW: 15 % (ref 11.5–15.5)
WBC: 12 10*3/uL — AB (ref 4.0–10.5)

## 2016-10-01 LAB — COMPREHENSIVE METABOLIC PANEL
ALBUMIN: 2.9 g/dL — AB (ref 3.5–5.0)
ALT: 69 U/L — AB (ref 14–54)
AST: 48 U/L — AB (ref 15–41)
Alkaline Phosphatase: 284 U/L — ABNORMAL HIGH (ref 38–126)
Anion gap: 9 (ref 5–15)
BUN: 20 mg/dL (ref 6–20)
CHLORIDE: 106 mmol/L (ref 101–111)
CO2: 22 mmol/L (ref 22–32)
CREATININE: 1.08 mg/dL — AB (ref 0.44–1.00)
Calcium: 9.1 mg/dL (ref 8.9–10.3)
GFR calc Af Amer: >60 mL/min (ref >60)
GLUCOSE: 84 mg/dL (ref 65–99)
POTASSIUM: 4.1 mmol/L (ref 3.5–5.1)
Sodium: 137 mmol/L (ref 135–145)
Total Bilirubin: 0.5 mg/dL (ref 0.3–1.2)
Total Protein: 6.4 g/dL — ABNORMAL LOW (ref 6.5–8.1)

## 2016-10-01 LAB — PROTEIN / CREATININE RATIO, URINE
CREATININE, URINE: 109 mg/dL
Protein Creatinine Ratio: 0.67 mg/mg{Cre} — ABNORMAL HIGH (ref 0.00–0.15)
Total Protein, Urine: 73 mg/dL

## 2016-10-01 LAB — TYPE AND SCREEN
ABO/RH(D): A POS
ANTIBODY SCREEN: NEGATIVE

## 2016-10-01 MED ORDER — LACTATED RINGERS IV SOLN: 500.0000 mL | INTRAVENOUS | Status: DC | PRN

## 2016-10-01 MED ORDER — OXYCODONE-ACETAMINOPHEN 5-325 MG PO TABS: 2.0000 | ORAL_TABLET | ORAL | Status: DC | PRN

## 2016-10-01 MED ORDER — LIDOCAINE HCL (PF) 1 % IJ SOLN
30.0000 mL | INTRAMUSCULAR | Status: DC | PRN
  Administered 2016-10-02: 30 mL via SUBCUTANEOUS
  Filled 2016-10-01: qty 30

## 2016-10-01 MED ORDER — MAGNESIUM SULFATE 40 G IN LACTATED RINGERS - SIMPLE
2.0000 g/h | INTRAVENOUS | Status: DC
  Administered 2016-10-02: 1 g/h via INTRAVENOUS
  Filled 2016-10-01: qty 500

## 2016-10-01 MED ORDER — MAGNESIUM SULFATE BOLUS VIA INFUSION
4.0000 g | Freq: Once | INTRAVENOUS | Status: AC
  Administered 2016-10-01: 4 g via INTRAVENOUS
  Filled 2016-10-01: qty 500

## 2016-10-01 MED ORDER — ACETAMINOPHEN 325 MG PO TABS: 650.0000 mg | ORAL_TABLET | ORAL | Status: DC | PRN

## 2016-10-01 MED ORDER — OXYTOCIN BOLUS FROM INFUSION
500.0000 mL | Freq: Once | INTRAVENOUS | Status: AC
  Administered 2016-10-02: 500 mL via INTRAVENOUS

## 2016-10-01 MED ORDER — ONDANSETRON HCL 4 MG/2ML IJ SOLN: 4.0000 mg | Freq: Four times a day (QID) | INTRAMUSCULAR | Status: DC | PRN

## 2016-10-01 MED ORDER — FLEET ENEMA 7-19 GM/118ML RE ENEM: 1.0000 | ENEMA | RECTAL | Status: DC | PRN

## 2016-10-01 MED ORDER — LACTATED RINGERS IV SOLN: INTRAVENOUS | Status: DC

## 2016-10-01 MED ORDER — SOD CITRATE-CITRIC ACID 500-334 MG/5ML PO SOLN: 30.0000 mL | ORAL | Status: DC | PRN

## 2016-10-01 MED ORDER — OXYCODONE-ACETAMINOPHEN 5-325 MG PO TABS
1.0000 | ORAL_TABLET | ORAL | Status: DC | PRN
  Administered 2016-10-02: 1 via ORAL
  Filled 2016-10-01: qty 1

## 2016-10-01 MED ORDER — LIDOCAINE HCL (PF) 1 % IJ SOLN
INTRAMUSCULAR | Status: AC
  Administered 2016-10-02: 30 mL via SUBCUTANEOUS
  Filled 2016-10-01: qty 30

## 2016-10-01 MED ORDER — HYDRALAZINE HCL 20 MG/ML IJ SOLN: 10.0000 mg | Freq: Once | INTRAMUSCULAR | Status: DC | PRN

## 2016-10-01 MED ORDER — OXYTOCIN 40 UNITS IN LACTATED RINGERS INFUSION - SIMPLE MED
INTRAVENOUS | Status: AC
  Administered 2016-10-02: 500 mL via INTRAVENOUS
  Filled 2016-10-01: qty 1000

## 2016-10-01 MED ORDER — SODIUM CHLORIDE 0.9 % IV SOLN
2.0000 g | Freq: Once | INTRAVENOUS | Status: AC
  Administered 2016-10-01: 2 g via INTRAVENOUS
  Filled 2016-10-01: qty 2000

## 2016-10-01 MED ORDER — LABETALOL HCL 5 MG/ML IV SOLN
INTRAVENOUS | Status: AC
  Administered 2016-10-02: 20 mg via INTRAVENOUS
  Filled 2016-10-01: qty 12

## 2016-10-01 MED ORDER — OXYTOCIN 40 UNITS IN LACTATED RINGERS INFUSION - SIMPLE MED: 2.5000 [IU]/h | INTRAVENOUS | Status: DC

## 2016-10-01 MED ORDER — LABETALOL HCL 5 MG/ML IV SOLN
20.0000 mg | INTRAVENOUS | Status: AC | PRN
  Administered 2016-10-01: 40 mg via INTRAVENOUS
  Administered 2016-10-01 – 2016-10-02 (×3): 20 mg via INTRAVENOUS

## 2016-10-01 MED ORDER — LABETALOL HCL 5 MG/ML IV SOLN
INTRAVENOUS | Status: AC
  Filled 2016-10-01: qty 4

## 2016-10-01 MED ORDER — LACTATED RINGERS IV SOLN
INTRAVENOUS | Status: DC
  Administered 2016-10-01: 22:00:00 via INTRAVENOUS

## 2016-10-01 MED ORDER — LABETALOL HCL 5 MG/ML IV SOLN
INTRAVENOUS | Status: AC
  Filled 2016-10-01: qty 8

## 2016-10-01 MED ORDER — FENTANYL CITRATE (PF) 100 MCG/2ML IJ SOLN
100.0000 ug | INTRAMUSCULAR | Status: DC | PRN
  Administered 2016-10-01: 100 ug via INTRAVENOUS

## 2016-10-01 MED ORDER — FENTANYL CITRATE (PF) 100 MCG/2ML IJ SOLN
INTRAMUSCULAR | Status: AC
  Filled 2016-10-01: qty 2

## 2016-10-01 NOTE — H&P (Signed)
LABOR AND DELIVERY ADMISSION HISTORY AND PHYSICAL NOTE  Sylvia Chase is a 41 y.o. female [phone] with IUP at [redacted]w[redacted]d by 27 wk [place] presenting for SOL.   Contractions started today, but about 6PM became very strong and regular. She was having her baby shower today and had to leave due to the contractions. She also began having elevated BPs this week in office, labs were performed 2 days ago, showed a new elevated PC ratio 0.341 (baseline and 2 weeks ago was 0.119-0.187) and mildly elevated AST (53), which would lead to a new diagnosis of preeclampsia.  She reports positive fetal movement. She denies leakage of fluid, admits to some vaginal bleeding that started this evening.  Prenatal History/Complications:  Past Medical History: Past Medical History:  Diagnosis Date  . Diabetes mellitus without complication (HCC)   . Hypertension     Past Surgical History: Past Surgical History:  Procedure Laterality Date  . NO PAST SURGERIES      Obstetrical History: OB History    Gravida Para Term Preterm AB Living   5 3 3  0 1 3   SAB TAB Ectopic Multiple Live Births   1 0 0 0 3      Social History: Social History   Social History  . Marital status: Single    Spouse name: N/A  . Number of children: N/A  . Years of education: N/A   Social History Main Topics  . Smoking status: Never Smoker  . Smokeless tobacco: Never Used  . Alcohol use No  . Drug use: No  . Sexual activity: Yes    Birth control/ protection: None   Other Topics Concern  . None   Social History Narrative  . None    Family History: Family History  Problem Relation Age of Onset  . Kidney disease Sister     Allergies: No Known Allergies  Prescriptions Prior to Admission  Medication Sig Dispense Refill Last Dose  . aspirin EC 81 MG tablet Take 81 mg by mouth daily.   10/01/2016 at Unknown time  . glipiZIDE (GLUCOTROL) 10 MG tablet Take 10 mg by mouth daily before breakfast.   10/01/2016 at Unknown  time  . metFORMIN (GLUCOPHAGE) 500 MG tablet Take 1,500 mg by mouth 2 (two) times daily before lunch and supper.   10/01/2016 at Unknown time  . Prenatal Vit-Fe Fumarate-FA (MULTIVITAMIN-PRENATAL) 27-0.8 MG TABS tablet Take 1 tablet by mouth daily at 12 noon.   10/01/2016 at Unknown time     Review of Systems   All systems reviewed and negative except as stated in HPI  Physical Exam Blood pressure (!) 172/98, pulse 74, temperature 98.2 F (36.8 C), temperature source Oral, resp. rate 20, height 5\' 1"  (1.549 m), weight 152 lb (68.9 kg), last menstrual period 01/22/2016, SpO2 100 %. General appearance: alert, cooperative, appears stated age and mild distress when contracting Lungs: clear to auscultation bilaterally Heart: regular rate and rhythm Abdomen: soft, non-tender; bowel sounds normal Extremities: No calf swelling or tenderness Presentation: cephalic Fetal monitoring: 140 bpm, mod var, no accels, no decels Uterine activity: q59min Dilation: 5 Effacement (%): 90 Station: -1 Exam by:: Dr Sylvia Chase   Prenatal labs: ABO, Rh: A/Positive/-- (11/06 0000) Antibody: Negative (11/06 0000) Rubella: !Error! IMMUNE RPR: Non Reactive (02/26 0836)  HBsAg: Negative (11/06 0000)  HIV: Non Reactive (02/26 0836)  GBS: Positive (04/30 0844)  1 hr Glucola: DID NOT PERFORM, DM type 2 Genetic screening:  Too late Anatomy US: Normal, Fetal ECHO 4/12,  normal  Prenatal Transfer Tool  Maternal Diabetes: Yes:  Diabetes Type:  Pre-pregnancy Genetic Screening: Declined Maternal Ultrasounds/Referrals: Normal Fetal Ultrasounds or other Referrals:  Fetal echo Maternal Substance Abuse:  No Significant Maternal Medications:  Meds include: Other:  Metformin, glyburide, ASA Significant Maternal Lab Results: Lab values include: Group B Strep positive  No results found for this or any previous visit (from the past 24 hour(s)).  Patient Active Problem List   Diagnosis Date Noted  . Language barrier  08/29/2016  . Supervision of high risk pregnancy, antepartum 07/25/2016  . Elderly multigravida in first trimester 07/25/2016  . Diabetes mellitus in pregnancy 07/25/2016  . Hypertension in pregnancy 07/25/2016  . Hyperlipidemia 04/04/2016    Assessment: Sylvia Chase is a 41 y.o. [id number] at 1573w6d here for SOL, preeclampsia with now severe features given severe range BPs  #Labor: SOL, augment as needed #Pain:  IV pain meds prn, OK for epidural if requests #FWB:  Cat I-II #ID:  GBS POS - Amp #MOF:  Breast #MOC: IUD #Circ:  Does not want  Sylvia MowElizabeth Amra Shukla, DO OB Fellow [facility] 10/01/2016, 10:29 PM

## 2016-10-01 NOTE — MAU Note (Signed)
Contractions every few minutes and blood tinge mucus. Started getting strong on way here. No leaking.  Baby moving well.

## 2016-10-02 ENCOUNTER — Encounter (HOSPITAL_COMMUNITY): Payer: Self-pay

## 2016-10-02 DIAGNOSIS — O1414 Severe pre-eclampsia complicating childbirth: Secondary | ICD-10-CM

## 2016-10-02 DIAGNOSIS — Z3A36 36 weeks gestation of pregnancy: Secondary | ICD-10-CM

## 2016-10-02 DIAGNOSIS — O99824 Streptococcus B carrier state complicating childbirth: Secondary | ICD-10-CM

## 2016-10-02 LAB — COMPREHENSIVE METABOLIC PANEL
ALK PHOS: 245 U/L — AB (ref 38–126)
ALT: 63 U/L — AB (ref 14–54)
AST: 52 U/L — ABNORMAL HIGH (ref 15–41)
Albumin: 2.5 g/dL — ABNORMAL LOW (ref 3.5–5.0)
Anion gap: 11 (ref 5–15)
BUN: 20 mg/dL (ref 6–20)
CALCIUM: 7.8 mg/dL — AB (ref 8.9–10.3)
CO2: 20 mmol/L — AB (ref 22–32)
CREATININE: 1.08 mg/dL — AB (ref 0.44–1.00)
Chloride: 99 mmol/L — ABNORMAL LOW (ref 101–111)
Glucose, Bld: 208 mg/dL — ABNORMAL HIGH (ref 65–99)
Potassium: 3.7 mmol/L (ref 3.5–5.1)
SODIUM: 130 mmol/L — AB (ref 135–145)
Total Bilirubin: 0.6 mg/dL (ref 0.3–1.2)
Total Protein: 5.7 g/dL — ABNORMAL LOW (ref 6.5–8.1)

## 2016-10-02 LAB — ABO/RH: ABO/RH(D): A POS

## 2016-10-02 LAB — CBC
HEMATOCRIT: 35.7 % — AB (ref 36.0–46.0)
HEMOGLOBIN: 12.5 g/dL (ref 12.0–15.0)
MCH: 30.7 pg (ref 26.0–34.0)
MCHC: 35 g/dL (ref 30.0–36.0)
MCV: 87.7 fL (ref 78.0–100.0)
Platelets: 139 10*3/uL — ABNORMAL LOW (ref 150–400)
RBC: 4.07 MIL/uL (ref 3.87–5.11)
RDW: 15 % (ref 11.5–15.5)
WBC: 23.3 10*3/uL — ABNORMAL HIGH (ref 4.0–10.5)

## 2016-10-02 LAB — GLUCOSE, CAPILLARY
Glucose-Capillary: 138 mg/dL — ABNORMAL HIGH (ref 65–99)
Glucose-Capillary: 219 mg/dL — ABNORMAL HIGH (ref 65–99)
Glucose-Capillary: 122 mg/dL — ABNORMAL HIGH (ref 65–99)

## 2016-10-02 LAB — RPR: RPR: NONREACTIVE

## 2016-10-02 LAB — MAGNESIUM: Magnesium: 3.9 mg/dL — ABNORMAL HIGH (ref 1.7–2.4)

## 2016-10-02 MED ORDER — OXYTOCIN 40 UNITS IN LACTATED RINGERS INFUSION - SIMPLE MED: 2.5000 [IU]/h | INTRAVENOUS | Status: DC | PRN

## 2016-10-02 MED ORDER — TETANUS-DIPHTH-ACELL PERTUSSIS 5-2.5-18.5 LF-MCG/0.5 IM SUSP: 0.5000 mL | Freq: Once | INTRAMUSCULAR | Status: DC

## 2016-10-02 MED ORDER — MISOPROSTOL 200 MCG PO TABS
800.0000 ug | ORAL_TABLET | Freq: Once | ORAL | Status: AC
  Administered 2016-10-02: 800 ug via RECTAL

## 2016-10-02 MED ORDER — SODIUM CHLORIDE 0.9 % IV SOLN: 250.0000 mL | INTRAVENOUS | Status: DC | PRN

## 2016-10-02 MED ORDER — ZOLPIDEM TARTRATE 5 MG PO TABS: 5.0000 mg | ORAL_TABLET | Freq: Every evening | ORAL | Status: DC | PRN

## 2016-10-02 MED ORDER — SENNOSIDES-DOCUSATE SODIUM 8.6-50 MG PO TABS
2.0000 | ORAL_TABLET | ORAL | Status: DC
  Administered 2016-10-03 – 2016-10-04 (×2): 2 via ORAL
  Filled 2016-10-02 (×2): qty 2

## 2016-10-02 MED ORDER — LABETALOL HCL 5 MG/ML IV SOLN: 20.0000 mg | INTRAVENOUS | Status: DC | PRN

## 2016-10-02 MED ORDER — HYDRALAZINE HCL 20 MG/ML IJ SOLN: 10.0000 mg | Freq: Once | INTRAMUSCULAR | Status: DC | PRN

## 2016-10-02 MED ORDER — BENZOCAINE-MENTHOL 20-0.5 % EX AERO
1.0000 | INHALATION_SPRAY | CUTANEOUS | Status: DC | PRN
Start: 2016-10-02 — End: 2016-10-04
  Administered 2016-10-02: 1 via TOPICAL
  Filled 2016-10-02: qty 56

## 2016-10-02 MED ORDER — ONDANSETRON HCL 4 MG/2ML IJ SOLN: 4.0000 mg | INTRAMUSCULAR | Status: DC | PRN

## 2016-10-02 MED ORDER — MISOPROSTOL 200 MCG PO TABS
ORAL_TABLET | ORAL | Status: AC
Start: 2016-10-02 — End: 2016-10-02
  Filled 2016-10-02: qty 4

## 2016-10-02 MED ORDER — GLIPIZIDE ER 10 MG PO TB24
10.0000 mg | ORAL_TABLET | Freq: Every day | ORAL | Status: DC
  Administered 2016-10-02 – 2016-10-04 (×3): 10 mg via ORAL
  Filled 2016-10-02 (×5): qty 1

## 2016-10-02 MED ORDER — COCONUT OIL OIL: 1.0000 "application " | TOPICAL_OIL | Status: DC | PRN

## 2016-10-02 MED ORDER — PRENATAL MULTIVITAMIN CH
1.0000 | ORAL_TABLET | Freq: Every day | ORAL | Status: DC
  Administered 2016-10-02 – 2016-10-04 (×3): 1 via ORAL
  Filled 2016-10-02 (×3): qty 1

## 2016-10-02 MED ORDER — INSULIN ASPART 100 UNIT/ML ~~LOC~~ SOLN
0.0000 [IU] | Freq: Three times a day (TID) | SUBCUTANEOUS | Status: DC
  Administered 2016-10-02: 5 [IU] via SUBCUTANEOUS
  Administered 2016-10-02 – 2016-10-04 (×2): 2 [IU] via SUBCUTANEOUS

## 2016-10-02 MED ORDER — IBUPROFEN 600 MG PO TABS
600.0000 mg | ORAL_TABLET | Freq: Four times a day (QID) | ORAL | Status: DC
  Administered 2016-10-02 – 2016-10-04 (×10): 600 mg via ORAL
  Filled 2016-10-02 (×10): qty 1

## 2016-10-02 MED ORDER — MAGNESIUM SULFATE 40 G IN LACTATED RINGERS - SIMPLE
1.0000 g/h | INTRAVENOUS | Status: AC
  Filled 2016-10-02: qty 500

## 2016-10-02 MED ORDER — WITCH HAZEL-GLYCERIN EX PADS: 1.0000 "application " | MEDICATED_PAD | CUTANEOUS | Status: DC | PRN

## 2016-10-02 MED ORDER — DIBUCAINE 1 % RE OINT: 1.0000 "application " | TOPICAL_OINTMENT | RECTAL | Status: DC | PRN

## 2016-10-02 MED ORDER — DIPHENHYDRAMINE HCL 25 MG PO CAPS: 25.0000 mg | ORAL_CAPSULE | Freq: Four times a day (QID) | ORAL | Status: DC | PRN

## 2016-10-02 MED ORDER — ONDANSETRON HCL 4 MG PO TABS: 4.0000 mg | ORAL_TABLET | ORAL | Status: DC | PRN

## 2016-10-02 MED ORDER — SODIUM CHLORIDE 0.9% FLUSH: 3.0000 mL | INTRAVENOUS | Status: DC | PRN

## 2016-10-02 MED ORDER — AMLODIPINE BESYLATE 5 MG PO TABS
5.0000 mg | ORAL_TABLET | Freq: Every day | ORAL | Status: DC
  Administered 2016-10-02 – 2016-10-04 (×3): 5 mg via ORAL
  Filled 2016-10-02 (×4): qty 1

## 2016-10-02 MED ORDER — SIMETHICONE 80 MG PO CHEW: 80.0000 mg | CHEWABLE_TABLET | ORAL | Status: DC | PRN

## 2016-10-02 MED ORDER — ACETAMINOPHEN 325 MG PO TABS: 650.0000 mg | ORAL_TABLET | ORAL | Status: DC | PRN

## 2016-10-02 MED ORDER — SODIUM CHLORIDE 0.9% FLUSH
3.0000 mL | Freq: Two times a day (BID) | INTRAVENOUS | Status: DC
  Administered 2016-10-03 – 2016-10-04 (×3): 3 mL via INTRAVENOUS

## 2016-10-02 MED ORDER — LACTATED RINGERS IV SOLN
INTRAVENOUS | Status: DC
  Administered 2016-10-02: 21:00:00 via INTRAVENOUS

## 2016-10-02 NOTE — Progress Notes (Signed)
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 Subjective: no complaints and up ad lib  Objective: Blood pressure (!) 148/80, pulse 75, temperature 98.4 F (36.9 C), temperature source Oral, resp. rate 18, height 5\' 1"  (1.549 m), weight 68.9 kg (152 lb), last menstrual period 01/22/2016, SpO2 96 %, unknown if currently breastfeeding. cbg 219 this a.m 6:00. Physical Exam:  General: alert, cooperative and appears stated age Lochia: appropriate Uterine Fundus:  Incision:  DVT Evaluation: No evidence of DVT seen on physical exam.   Recent Labs  09/29/16 0851 10/01/16 2148  HGB  --  13.1  HCT 40.8 38.2    Assessment/Plan: Chtn / SIPE= Mag x 24 hr pp, will start on Norvasc 5 today Class B DM   Resume Glypidize xl 10 daily and SSI ordered.    LOS: 1 day   Sylvia Chase V 10/02/2016, 6:18 AM

## 2016-10-02 NOTE — Progress Notes (Signed)
CBG 109, result did not flow into chart from glucose meter

## 2016-10-02 NOTE — Progress Notes (Signed)
Patient ID: Sylvia Chase, female   DOB: 1975-12-14, 41 y.o.   MRN: [id number]  S: Patient seen & examined for progress of labor. Patient comfortable breathing through contractions, refusing pain medicine or epidural   O:  Vitals:   10/02/16 0000 10/02/16 0049 10/02/16 0055 10/02/16 0106  BP: (!) 148/81 (!) 163/69 (!) 142/84 (!) 148/98  Pulse: 79 72 73 72  Resp: 20   (!) 21  Temp:      TempSrc:      SpO2:      Weight:      Height:        Dilation: 5 Effacement (%): 80 Cervical Position: Middle Station: 0 Presentation: Vertex Exam by:: Dr. Omer JackMumaw    AROM performed with clear bloody fluid return;  IUPC placed with ease  FHT: 120bpm, mod var, no accels, early decels TOCO: q502min  A/P: AROM IUPC Decreased magnesium to 1g/hr due to elevation in creatinine to 1.08 Continue expectant management Anticipate SVD

## 2016-10-02 NOTE — Lactation Note (Signed)
This note was copied from a baby's chart. Lactation Consultation Note  Patient Name: Sylvia Chase ZOXWR'UToday's Date: 10/02/2016 Reason for consult: Initial assessment Interpreter used. Baby at 8 hr of life. Mom desires to offer breast and formula "until my milk comes in" . Baby will have breast when mom is home and formula when mom is working. Discussed baby behavior, feeding frequency, baby belly size, risks of formula, pumping, voids, wt loss, breast changes, and nipple care. Demonstrated manual expression, large drops of colostrum noted bilaterally. Given lactation handouts. Aware of OP services and support group. Mom will offer the breast on demand and f/u with formula as needed per volume guidelines.      Maternal Data Has patient been taught Hand Expression?: Yes Does the patient have breastfeeding experience prior to this delivery?: Yes  Feeding Feeding Type: Bottle Fed - Formula  LATCH Score/Interventions                      Lactation Tools Discussed/Used WIC Program: Yes   Consult Status Consult Status: Follow-up Date: 10/03/16 Follow-up type: In-patient    Sylvia Chase 10/02/2016, 10:16 AM

## 2016-10-03 ENCOUNTER — Other Ambulatory Visit: Payer: Self-pay | Admitting: Family Medicine

## 2016-10-03 ENCOUNTER — Encounter (HOSPITAL_COMMUNITY): Payer: Self-pay | Admitting: *Deleted

## 2016-10-03 LAB — GLUCOSE, CAPILLARY
GLUCOSE-CAPILLARY: 103 mg/dL — AB (ref 65–99)
GLUCOSE-CAPILLARY: 83 mg/dL (ref 65–99)
GLUCOSE-CAPILLARY: 87 mg/dL (ref 65–99)
Glucose-Capillary: 134 mg/dL — ABNORMAL HIGH (ref 65–99)

## 2016-10-03 NOTE — Lactation Note (Signed)
This note was copied from a baby's chart. Lactation Consultation Note  Patient Name: Sylvia Chase NFAOZ'HToday's Date: 10/03/2016 Reason for consult: Follow-up assessment;NICU baby;Other (Comment) (Early Term Infant)   Follow up consult with mom of 4332 hour old infant. Spoke with mom using Hospital Spanish Interpreter, [name]. Mom reports she is not pumping yet. Mom reports she Bf her 1st child for 20 days and then the infant no longer interested and the 2nd and 3rd child for 1.5 years each.   Set up DEBP with instructions for use on Initiate setting, assembling, disassembling and cleaning of pump parts. Mom began pumping when LC in the room, enc mom to pump every 2-3 hours for 15 minutes followed by hand expression. Mom reports she knows how to hand express.  Providing Milk for your Baby in NICU booklet given and reviewed pumping and breast milk storage for NICU infant. Mom voiced understanding. Bottles, # stickers and colostrum collection containers given, reviewed labeling of BM with mom.   Mom without further questions/concerns at this time. Enc mom to ask NICU when she can put infant to breast.   [facility], she does not have a pump at [facility]    Maternal Data Formula Feeding for Exclusion: Yes Reason for exclusion: Mother's choice to formula and breast feed on admission Has patient been taught Hand Expression?: Yes Does the patient have breastfeeding experience prior to this delivery?: Yes  Feeding    LATCH Score/Interventions                      Lactation Tools Discussed/Used WIC Program: Yes Pump Review: Setup, frequency, and cleaning;Milk Storage Initiated by:: Noralee StainSharon Hice, RN, IBCLC Date initiated:: 10/03/16   Consult Status Consult Status: Follow-up Date: 10/04/16 Follow-up type: In-patient    Silas FloodSharon S Hice 10/03/2016, 9:27 AM

## 2016-10-03 NOTE — Progress Notes (Signed)
POSTPARTUM PROGRESS NOTE  Post Partum Day #1 Subjective:  Sylvia Chase is a 41 y.o. [id number] 2446w0d s/p SVD.  No acute events overnight.  Pt denies problems with ambulating, voiding or po intake.  She denies nausea or vomiting.  Pain is well controlled.  She has had flatus. She has had bowel movement.  Lochia Minimal.   Objective: Blood pressure 139/72, pulse 62, temperature 98.2 F (36.8 C), temperature source Oral, resp. rate 18, height 5\' 1"  (1.549 m), weight 152 lb (68.9 kg), last menstrual period 01/22/2016, SpO2 97 %, unknown if currently breastfeeding.  Physical Exam:  General: alert, cooperative and no distress Lochia:normal flow Chest: no respiratory distress Heart:regular rate, distal pulses intact Abdomen: soft, nontender,  Uterine Fundus: firm, appropriately tender DVT Evaluation: No calf swelling or tenderness Extremities: minimal edema   Recent Labs  10/01/16 2148 10/02/16 0637  HGB 13.1 12.5  HCT 38.2 35.7*    Assessment/Plan:  ASSESSMENT: Sylvia Chase is a 41 y.o. [id number] 8146w0d s/p SVD. Patient endorse mild HA but denies RUQ pain, vision troubles. No edema on exam. BP mildly elevated but patient has cHTN and is probably close to baseline. FBG was 83. Will continue to monitor BP and blood glucose.  Plan for discharge tomorrow   LOS: 2 days   Kaylyn Garrow DialloMD 10/03/2016, 8:00 AM

## 2016-10-04 LAB — GLUCOSE, CAPILLARY
GLUCOSE-CAPILLARY: 103 mg/dL — AB (ref 65–99)
GLUCOSE-CAPILLARY: 160 mg/dL — AB (ref 65–99)
GLUCOSE-CAPILLARY: 108 mg/dL — AB (ref 65–99)
GLUCOSE-CAPILLARY: 122 mg/dL — AB (ref 65–99)
Glucose-Capillary: 94 mg/dL (ref 65–99)

## 2016-10-04 MED ORDER — HYDROCHLOROTHIAZIDE 25 MG PO TABS: 25.0000 mg | ORAL_TABLET | Freq: Every day | ORAL | Status: DC

## 2016-10-04 MED ORDER — DOCUSATE SODIUM 100 MG PO CAPS: 100.0000 mg | ORAL_CAPSULE | Freq: Two times a day (BID) | ORAL | 0 refills | Status: AC

## 2016-10-04 MED ORDER — AMLODIPINE BESYLATE 10 MG PO TABS: 10.0000 mg | ORAL_TABLET | Freq: Every day | ORAL | 1 refills | Status: AC

## 2016-10-04 MED ORDER — AMLODIPINE BESYLATE 10 MG PO TABS: 10.0000 mg | ORAL_TABLET | Freq: Every day | ORAL | Status: DC

## 2016-10-04 MED ORDER — AMLODIPINE BESYLATE 5 MG PO TABS
5.0000 mg | ORAL_TABLET | Freq: Once | ORAL | Status: AC
  Administered 2016-10-04: 5 mg via ORAL

## 2016-10-04 MED ORDER — METFORMIN HCL 500 MG PO TABS
1000.0000 mg | ORAL_TABLET | Freq: Two times a day (BID) | ORAL | Status: DC
  Filled 2016-10-04 (×2): qty 2

## 2016-10-04 MED ORDER — IBUPROFEN 600 MG PO TABS: 600.0000 mg | ORAL_TABLET | Freq: Four times a day (QID) | ORAL | 0 refills | Status: AC

## 2016-10-04 MED ORDER — METFORMIN HCL 1000 MG PO TABS: 1000.0000 mg | ORAL_TABLET | Freq: Two times a day (BID) | ORAL | 1 refills | Status: AC

## 2016-10-04 NOTE — Lactation Note (Signed)
This note was copied from a baby's chart. Lactation Consultation Note  Patient Name: Sylvia Chase ZOXWR'UToday's Date: 10/04/2016  Mom would like to use manual pump until [facility]  Manual pump given with instructions.  Instructed to bring pump pieces with her when coming to NICU so she can use the pumps here.  Denies questions/concerns.  Stressed importance of pumping every 3 hours to establish milk supply.  Encouraged to call with questions.   Maternal Data    Feeding Feeding Type: Formula Nipple Type: Slow - flow  LATCH Score/Interventions                      Lactation Tools Discussed/Used     Consult Status      Huston FoleyMOULDEN, Cruze Zingaro S 10/04/2016, 4:25 PM

## 2016-10-04 NOTE — Discharge Instructions (Signed)
Instrucciones para la mamá sobre los cuidados en el hogar °(Home Care Instructions for Mom) °ACTIVIDAD °· Reanude sus actividades regulares de forma gradual. °· Descanse. Tome siestas cuando el bebé duerme. °· No levante objetos que pesen más de 10 libras (4,5 kg) hasta que el médico se lo autorice. °· Evite las actividades que demandan mucho esfuerzo y energía (que son extenuantes) hasta que el médico se lo autorice. Caminar a un ritmo tranquilo a moderado siempre es más seguro. °· Si tuvo un parto por cesárea: °? No pase la aspiradora, suba escaleras o conduzca un vehículo durante 4 o 6 semanas. °? Pídale a alguien que le brinde ayuda con las tareas domésticas hasta que pueda realizarlas por su cuenta. °? Haga ejercicios como se lo haya indicado el médico, si corresponde. ° °HEMORRAGIA VAGINAL °Probablemente continúe sangrando durante 4 o 6 semanas después del parto. Generalmente, la cantidad de sangre disminuye y el color se hace más claro con el transcurso del tiempo. Sin embargo, si usted está demasiado activa, el color de la sangre puede ser rojo brillante. Si necesita cambiarse la compresa higiénica en menos de una hora o tiene coágulos grandes: °· Permanezca acostada. °· Eleve los pies. °· Coloque compresas frías en la zona inferior del abdomen. °· Haga reposo. °· Comuníquese con su médico. °Si está amamantando, podría volver a tener su período entre las 8 semanas después del parto y el momento en que deje de amamantar. Si no está amamantando, volverá a tener su período 6 u 8 semanas después del parto. °CUIDADOS PERINEALES °La zona perineal o perineo, es la parte del cuerpo que se encuentra entre los muslos. Después del parto, esta zona necesita un cuidado especial. Siga las siguientes indicaciones como se lo haya indicado su médico. °· Tome baños de inmersión durante 15 o 20 minutos. °· Utilice apósitos o aerosoles analgésicos y cremas como se lo hayan indicado. °· No utilice tampones ni se haga duchas  vaginales hasta que el sangrado vaginal se haya detenido. °· Cada vez que vaya al baño: °? Use una botella perineal. °? Cámbiese el apósito. °? Use papel tisú en lugar de papel higiénico hasta que se cure la sutura. °· Haga ejercicios de Kegel todos los días. Los ejercicios Kegel ayudan a mantener los músculos que sostienen la vagina, la vejiga y los intestinos. Estos ejercicios se pueden realizar mientras está parada, sentada o acostada. Para hacer los ejercicios de Kegel: °? Tense los músculos del estómago y los que rodean el canal de parto. °? Mantenga esta posición durante unos segundos. °? Relájese. °? Repita hasta hacerlos 5 veces seguidas. °· Para evitar las hemorroides o que estas empeoren: °? Beba suficiente líquido para mantener la orina clara o de color amarillo pálido. °? Evite hacer fuerza al defecar. °? Tome los medicamentos y laxantes de venta libre como se lo haya indicado el médico. °CUIDADO DE LAS MAMAS °· Use un buen sostén. °· Evite tomar analgésicos de venta libre para las molestias de los pechos. °· Aplique hielo en los pechos para aliviar las molestias tanto como sea necesario: °? Ponga el hielo en una bolsa plástica. °? Coloque una toalla entre la piel y la bolsa de hielo. °? Aplique el hielo durante 20, o como se lo haya indicado el médico. ° °NUTRICIÓN °· Mantenga una dieta bien balanceada. °· No intente perder de peso rápidamente reduciendo el consumo de calorías. °· Tome sus vitaminas prenatales hasta el control de postparto o hasta que su médico se lo indique. ° °DEPRESIÓN POSTPARTO °  Puede sentir deseos de llorar sin motivo aparente y verse incapaz de enfrentarse a todos los cambios que implica tener un bebé. Este estado de ánimo se llama depresión postparto. La depresión postparto ocurre porque sus niveles hormonales sufren cambios después del parto. Si usted tiene depresión postparto, busque contención por parte de su pareja, sus amigos y su familia. Si la depresión no desaparece por  sí sola después de algunas semanas, concurra a su médico. °AUTOEXAMEN DE MAMAS °Realícese autoexámenes en el mismo momento cada mes. Si está amamantando, el mejor momento de controlar sus mamas es después de alimentar al bebé, cuando los pechos no están tan llenos. Si está amamantando y su período ya comenzó, controle sus mamas el día 5, 6 o 7 de su período. °Informe a su médico de cualquier protuberancia, bulto o secreción. Si está amamantando, las mamas normalmente tienen bultos. Esto es transitorio y no es un riesgo para la salud. °INTIMIDAD Y SEXUALIDAD °Debe evitar las relaciones sexuales durante al menos 3 o 4 semanas después del parto o hasta que el flujo de color rojo amarronado haya desaparecido completamente. Si no desea quedar embarazada nuevamente, use algún método anticonceptivo. Después del parto, puede quedar embarazada incluso si no ha tenido todavía el período. °SOLICITE ATENCIÓN MÉDICA SI: °· Se siente incapaz de controlar los cambios que implica tener un hijo y esos sentimientos no desaparecen después de algunas semanas. °· Detecta una protuberancia, bulto o secreción en sus mamas. ° °SOLICITE ATENCIÓN MÉDICA DE INMEDIATO SI: °· Debe cambiarse la compresa higiénica en 1 hora o menos. °· Tiene los siguientes síntomas: °? Dolor intenso o calambres en la parte inferior del abdomen. °? Una secreción vaginal con mal olor. °? Fiebre que no se alivia con los medicamentos. °? Una zona de la mama se pone roja y le causa dolor, y además usted tiene fiebre. °? Una pantorrilla enrojecida y con dolor. °? Repentino e intenso dolor en el pecho. °? Falta de aire. °? Micción dolorosa o con sangre. °? Problemas visuales. °· Vómitos durante 12 horas o más. °· Dolor de cabeza intenso. °· Tiene pensamientos serios acerca de lastimarse a usted misma o dañar al niño o a otra persona. ° °Esta información no tiene como fin reemplazar el consejo del médico. Asegúrese de hacerle al médico cualquier pregunta que  tenga. °Document Released: 05/16/2005 Document Revised: 09/07/2015 Document Reviewed: 11/17/2014 °Elsevier Interactive Patient Education © 2017 Elsevier Inc. ° °

## 2016-10-04 NOTE — Discharge Summary (Signed)
OB Discharge Summary     Patient Name: Sylvia Chase DOB: 1975-10-25 MRN: [id number]  Date of admission: 10/01/2016 Delivering MD: Michaele Offer   Date of discharge: 10/04/2016  Admitting diagnosis: 40 WKS, CTXS Intrauterine pregnancy: [redacted]w[redacted]d     Secondary diagnosis:  Active Problems:   Elderly multigravida in first trimester   Diabetes mellitus in pregnancy   Chronic hypertension with superimposed preeclampsia  Additional problems: None     Discharge diagnosis: Term Pregnancy Delivered, CHTN with superimposed preeclampsia and GDM A2                                                                                                Post partum procedures:24 hours of IV Magnesium   Augmentation: N/A  Complications: None  Hospital course:  Onset of Labor With Vaginal Delivery     41 y.o. yo [id number] at [redacted]w[redacted]d was admitted in Active Labor on 10/01/2016. Patient had an uncomplicated labor course as follows:  Membrane Rupture Time/Date: 12:55 AM ,10/02/2016   Intrapartum Procedures: Episiotomy: None [1]                                         Lacerations:  2nd degree [3]  Patient had a delivery of a Viable infant. 10/02/2016  Information for the patient's newborn:  Neri, Vieyra [[id number]]  Delivery Method: Vaginal, Spontaneous Delivery (Filed from Delivery Summary)    Pateint had an uncomplicated postpartum course.  She is ambulating, tolerating a regular diet, passing flatus, and urinating well. Patient is discharged home in stable condition on 10/04/16.   Physical exam  Vitals:   10/04/16 0845 10/04/16 0958 10/04/16 1216 10/04/16 1342  BP: (!) 164/83 (!) 144/86 (!) 163/91 129/68  Pulse: 80  66 63  Resp:   18   Temp:   98.4 F (36.9 C)   TempSrc:      SpO2:   97%   Weight:      Height:       General: alert, cooperative and no distress Lochia: appropriate Uterine Fundus: firm Incision: N/A DVT Evaluation: No evidence of DVT seen on physical  exam. Labs: Lab Results  Component Value Date   WBC 23.3 (H) 10/02/2016   HGB 12.5 10/02/2016   HCT 35.7 (L) 10/02/2016   MCV 87.7 10/02/2016   PLT 139 (L) 10/02/2016   CMP Latest Ref Rng & Units 10/02/2016  Glucose 65 - 99 mg/dL 295(M)  BUN 6 - 20 mg/dL 20  Creatinine 8.41 - 3.24 mg/dL 4.01(U)  Sodium 272 - 536 mmol/L 130(L)  Potassium 3.5 - 5.1 mmol/L 3.7  Chloride 101 - 111 mmol/L 99(L)  CO2 22 - 32 mmol/L 20(L)  Calcium 8.9 - 10.3 mg/dL 7.8(L)  Total Protein 6.5 - 8.1 g/dL 6.4(Q)  Total Bilirubin 0.3 - 1.2 mg/dL 0.6  Alkaline Phos 38 - 126 U/L 245(H)  AST 15 - 41 U/L 52(H)  ALT 14 - 54 U/L 63(H)    Discharge instruction: per After Visit Summary and "  Baby and Me Booklet".  After visit meds:  Allergies as of 10/04/2016   No Known Allergies     Medication List    STOP taking these medications   glipiZIDE 10 MG 24 hr tablet Commonly known as:  GLUCOTROL XL   multivitamin-prenatal 27-0.8 MG Tabs tablet     TAKE these medications   amLODipine 10 MG tablet Commonly known as:  NORVASC Take 1 tablet (10 mg total) by mouth daily. Start taking on:  10/05/2016   aspirin EC 81 MG tablet Take 81 mg by mouth daily.   docusate sodium 100 MG capsule Commonly known as:  COLACE Take 1 capsule (100 mg total) by mouth 2 (two) times daily.   ibuprofen 600 MG tablet Commonly known as:  ADVIL,MOTRIN Take 1 tablet (600 mg total) by mouth every 6 (six) hours.   metFORMIN 1000 MG tablet Commonly known as:  GLUCOPHAGE Take 1 tablet (1,000 mg total) by mouth 2 (two) times daily with a meal. What changed:  medication strength  how much to take  when to take this       Diet: routine diet  Activity: Advance as tolerated. Pelvic rest for 6 weeks.   Outpatient follow up:6 weeks; 1 week for BP check in office Follow up Appt: Future Appointments Date Time Provider Department Center  10/12/2016 3:45 PM [facility] US 5 [facility]  11/15/2016 8:00 AM Rasch, Harolyn RutherfordJennifer I, NP  [facility]   Follow up Visit:No Follow-up on file.  Postpartum contraception: IUD Mirena  Newborn Data: Live born female  Birth Weight: 7 lb 5.6 oz (3334 g) APGAR: 8, 9  Baby Feeding: Breast Disposition:NICU   10/04/2016 Lovena NeighboursAbdoulaye Diallo, MD  OB FELLOW DISCHARGE ATTESTATION  I have seen and examined this patient and agree with above documentation in the resident's note.   Jen MowElizabeth Mumaw, DO OB Fellow

## 2016-10-04 NOTE — Progress Notes (Signed)
Discharge teaching complete with interpretor. Pt understood all information and did not have any questions. Pt discharged home to family.

## 2016-10-04 NOTE — Progress Notes (Signed)
Pt discharged.  Discharge instructions reviewed with interpreter.  No questions at this time.

## 2016-10-05 ENCOUNTER — Ambulatory Visit (HOSPITAL_COMMUNITY): Payer: Self-pay

## 2016-10-06 ENCOUNTER — Other Ambulatory Visit: Payer: Self-pay | Admitting: Obstetrics & Gynecology

## 2016-10-10 ENCOUNTER — Other Ambulatory Visit: Payer: Self-pay | Admitting: Family Medicine

## 2016-10-11 ENCOUNTER — Ambulatory Visit: Payer: Self-pay

## 2016-10-12 ENCOUNTER — Ambulatory Visit (HOSPITAL_COMMUNITY): Admission: RE | Admit: 2016-10-12 | Payer: Self-pay | Source: Ambulatory Visit

## 2016-10-13 ENCOUNTER — Other Ambulatory Visit: Payer: Self-pay | Admitting: Obstetrics & Gynecology

## 2016-10-14 ENCOUNTER — Telehealth (HOSPITAL_COMMUNITY): Payer: Self-pay | Admitting: Lactation Services

## 2016-10-14 ENCOUNTER — Ambulatory Visit: Payer: Self-pay

## 2016-10-14 NOTE — Telephone Encounter (Signed)
[facility], but only able to speak Spanish. Attempted to call mom back using [facility] Interpreter [place], #[phone], but mom did not answer. Interpreter left message to call back as needed. Mom was [facility] the schedule [facility] 1530 [facility]

## 2016-10-14 NOTE — Lactation Note (Signed)
This note was copied from a baby's chart. Lactation Consult for Sylvia Chase (DOB: [birth year]) and Sylvia Chase, mother  Mother's reason for visit: "to be taught how to breastfeed baby" Consult:  Initial Lactation Consultant:  Remigio Eisenmenger  ________________________________________________________________________ BW: 3334g (7# 5.6oz) Wt on 5-14 -18: 7# 6 oz Today's weight: 7# 14.7oz ________________________________________________________________________  Mother's Name: Sylvia Chase Type of delivery:  Vaginal, Spontaneous Delivery Breastfeeding Experience: P4, last 2 children fed for more than 1 year Maternal Medical Conditions:  Diabetes Maternal Medications: metformin  ________________________________________________________________________  Breastfeeding History (Post Discharge)  Frequency of breastfeeding: tries twice/day, but baby "gets mad" Duration of feeding: no latch  Supplementation  Method:  Bottle, 2 oz, 8 bottles/day  Pumping  Type of pump:  Symphony Frequency: q3h Volume: 4 oz/session  Infant Intake and Output Assessment  Voids: about 8 in 24 hrs.  Color:  Clear yellow Stools: 2 in 24 hrs.  Color:  Yellow  ________________________________________________________________________  Maternal Breast Assessment  Breast:  Full Nipple:  Erect  _______________________________________________________________________ Feeding Assessment/Evaluation  Initial feeding assessment:  Infant's oral assessment:  WNL  Attached assessment:  Deep, but sometimes not quite deep enough to transfer milk from the breast (only from the SNS on the fastest flow)  Lips flanged:  Yes.    Suck assessment:  Nutritive  Tools:  Nipple shield 24 mm and Supplemental nutrition system Instructed on use and cleaning of tool:  Yes.   Mom shown step-by-step how to clean SNS w/verbal instructions provided by interpreter.   15mL was given with bottle  prior to latching w/SNS to decrease infant's frustration  Pre-feed weight: 3592 g   Post-feed weight: 3618 g  Amount transferred: 0 ml Amount supplemented: (11 mL via SNS)  Pre-feed weight: 3618 g   Post-feed weight: 3618 g  Amount transferred: 0 ml Amount supplemented:25 ml w/bottle (after feeding attempt w/SNS) R breast, 12 minutes  Total amount transferred: 0 ml Total supplement given: 51  ml  Infant stopped feeding at the breast about 1 week ago (baby had been in NICU & received bottles during that time). Per Mom, he had been breastfeeding well before he decided to refuse the breast.   During consult, we attempted to latch Kelby Fam to the bare breast, but without success. His behavior mimicked that of nipple confusion. A nipple shield was added & prefilled w/formula. Manuel latched & drank the formula from the nipple shield, but did not continue to suckle. Mom wanted to try the double SNS. Unfortunately, despite Mom's full breasts, he was unable to transfer breast milk while at the breast (his latch was not quite deep enough to extract milk from the breast). Even though the fastest tubing was used, he only successfully drank from the SNS on one side. Per interpreter, Mom says she will try to use NS/SNS at home, but, if infant has decided to no longer take the breast, then she will stop attempting to breast feed (but will pump).    Even though Mom has a fine milk supply, she has been alternating bottles of EBM versus formula b/c she wanted to be sure that Kelby Fam "did both." I explained to Mom that she has plenty of milk & that one of the risks of formula feeding is developing diabetes (which the mother has). Mom said she will try to give more breast milk via bottle.   Infant diagnosed w/thrush by peds this week. Mother's breasts do not show evidence of a yeast infection.   Interpreter, [place],  present throughout consult.   Glenetta HewKim Danaysia Rader, RN, IBCLC

## 2016-10-17 ENCOUNTER — Other Ambulatory Visit: Payer: Self-pay | Admitting: Obstetrics and Gynecology

## 2016-10-19 ENCOUNTER — Ambulatory Visit (HOSPITAL_COMMUNITY): Payer: Self-pay

## 2016-10-20 ENCOUNTER — Other Ambulatory Visit: Payer: Self-pay | Admitting: Obstetrics and Gynecology

## 2016-11-15 ENCOUNTER — Encounter: Payer: Self-pay | Admitting: Obstetrics and Gynecology

## 2016-11-15 ENCOUNTER — Ambulatory Visit (INDEPENDENT_AMBULATORY_CARE_PROVIDER_SITE_OTHER): Payer: Self-pay | Admitting: Obstetrics and Gynecology

## 2016-11-15 VITALS — BP 121/75 | HR 75 | Wt 135.4 lb

## 2016-11-15 DIAGNOSIS — Z3041 Encounter for surveillance of contraceptive pills: Secondary | ICD-10-CM

## 2016-11-15 DIAGNOSIS — Z3202 Encounter for pregnancy test, result negative: Secondary | ICD-10-CM

## 2016-11-15 DIAGNOSIS — O99814 Abnormal glucose complicating childbirth: Secondary | ICD-10-CM

## 2016-11-15 LAB — POCT PREGNANCY, URINE: PREG TEST UR: NEGATIVE

## 2016-11-15 MED ORDER — NORETHINDRONE 0.35 MG PO TABS: 1.0000 | ORAL_TABLET | Freq: Every day | ORAL | 11 refills | Status: AC

## 2016-11-15 NOTE — Progress Notes (Signed)
Subjective:     Sylvia Chase is a 41 y.o. female who presents for a postpartum visit. She is 6 weeks postpartum following a spontaneous vaginal delivery. I have fully reviewed the prenatal and intrapartum course. The delivery was at 37 gestational weeks. Outcome: spontaneous vaginal delivery. Anesthesia: none. Postpartum course has been uncomplicated . Baby's course has been uncomplicated. Baby is feeding by bottle - Similac Advance. Bleeding no bleeding. Bowel function is normal. Bladder function is normal. Patient is not sexually active; confirmed this again with the patient and spanish interpretor. Contraception method is none. Postpartum depression screening: negative.  The following portions of the patient's history were reviewed and updated as appropriate: allergies, current medications, past family history, past medical history, past social history, past surgical history and problem list.  Review of Systems Pertinent items are noted in HPI.  Objective:    BP 121/75   Pulse 75   Wt 135 lb 6.4 oz (61.4 kg)   BMI 25.58 kg/m    General:  alert, cooperative and appears stated age   Breasts:  negative  Lungs: clear to auscultation bilaterally  Heart:  regular rate and rhythm, S1, S2 normal, no murmur, click, rub or gallop  Abdomen: normal findings: bowel sounds normal and soft, non-tender   Vulva:  normal  Vagina: normal vagina  Cervix:  Not examined   Corpus: not examined  Adnexa:  Not examined   Rectal Exam: Not performed.        Assessment:   Normal postpartum exam. Pap smear not done at today's visit. Last pap 2016: normal   Abnormal glucose tolerance in pregnancy, delivered - Plan: Glucose tolerance, 2 hours  Surveillance for birth control, oral contraceptives  Postpartum care and examination   Plan:   1. Contraception: oral progesterone-only contraceptive 2. Patient desires depo provera. Patient is self pay, patient instructed to call the HD  3. Follow up  in: or as needed.     Rasch, Harolyn RutherfordJennifer I, NP

## 2016-11-21 LAB — GLUCOSE TOLERANCE, 2 HOURS
GLUCOSE FASTING GTT: 96 mg/dL (ref 65–99)
GLUCOSE, 2 HOUR: 275 mg/dL — AB (ref 65–139)

## 2016-11-22 ENCOUNTER — Telehealth: Payer: Self-pay | Admitting: General Practice

## 2016-11-22 NOTE — Telephone Encounter (Signed)
Patient has diabetes and should follow up with a PCP. Called patient with [place] interpreter [phone] at mobile number, no answer & voicemail was not set up. Called patient at other listed number & a man answered stating he wasn't with her at the time but would tell her we called.

## 2016-11-28 NOTE — Telephone Encounter (Signed)
Called patient informed her of diabetes test and need to find a pcp provider. I have given her the number to [facility] [phone]

## 2018-02-21 IMAGING — US US MFM OB FOLLOW-UP
1 series · 13 of 28 positions shown · non-contrast
Comparison: none

[Series 1: us mfm ob follow-up · 13 of 31 slices shown]
[im 2/31]
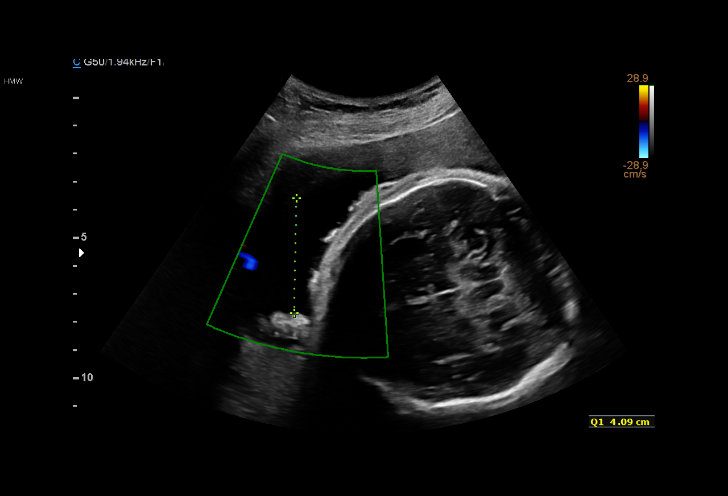
[im 4/31]
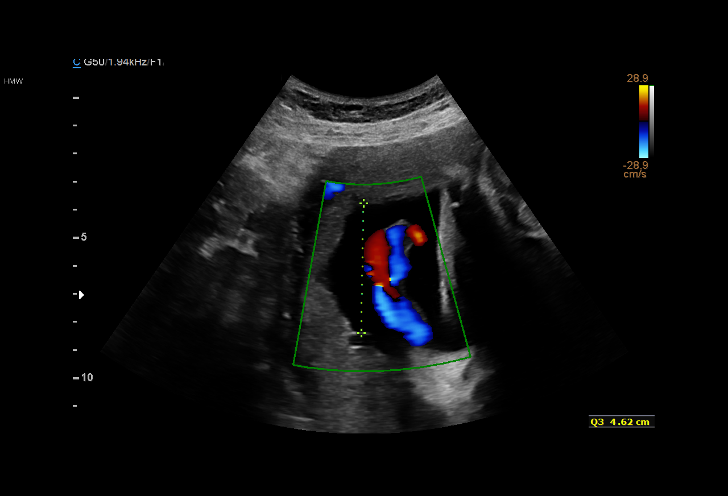
[im 6/31]
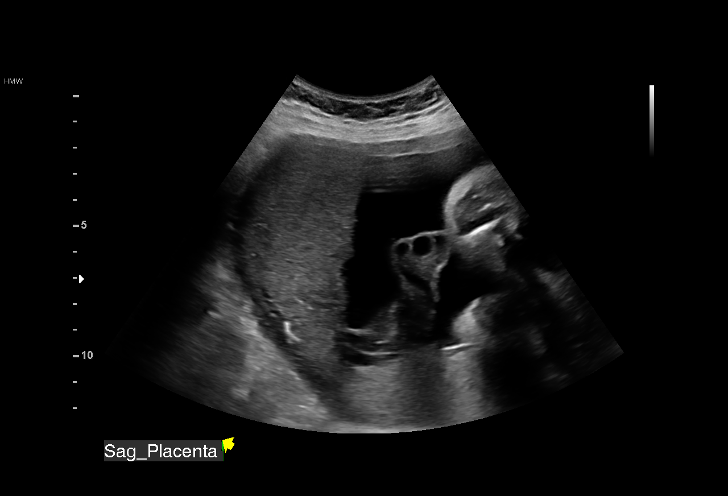
[im 8/31]
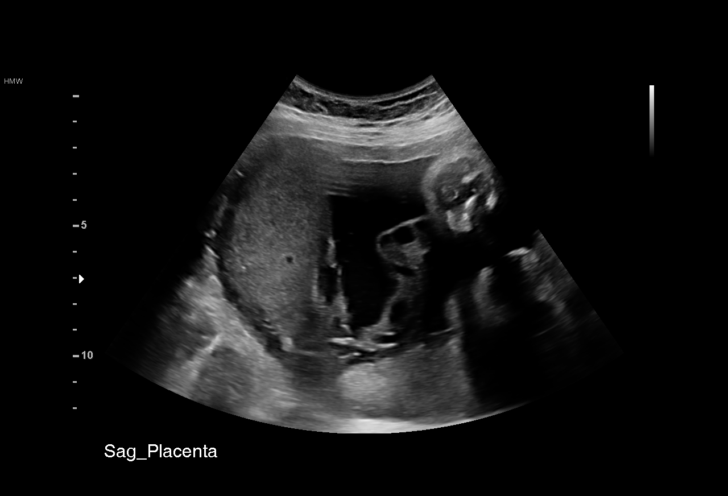
[im 11/31]
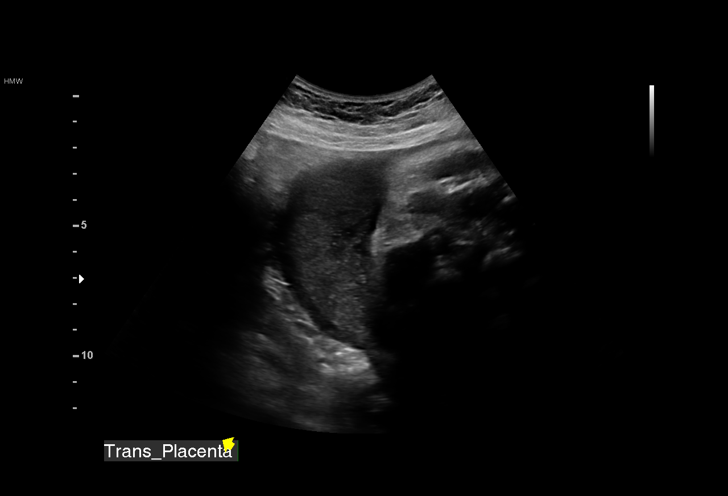
[im 13/31]
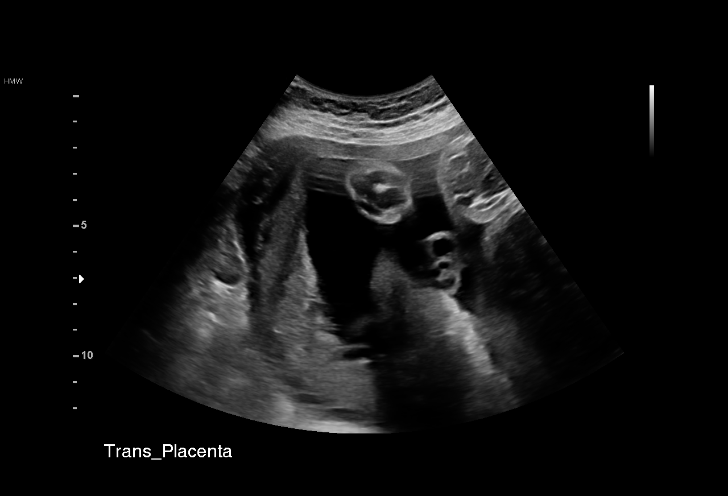
[im 16/31]
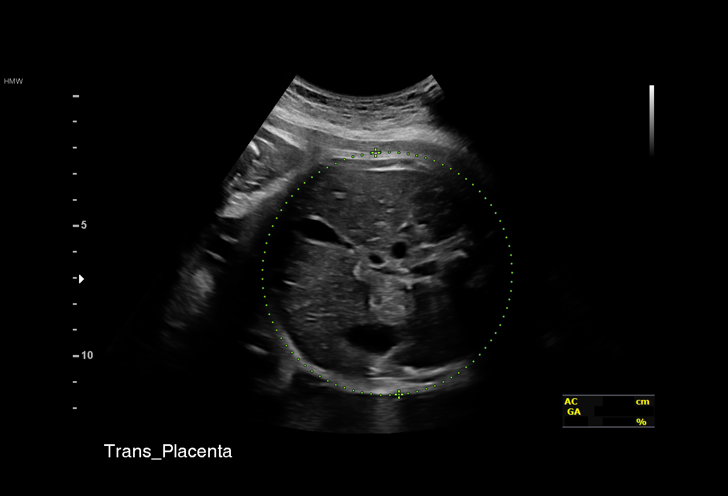
[im 18/31]
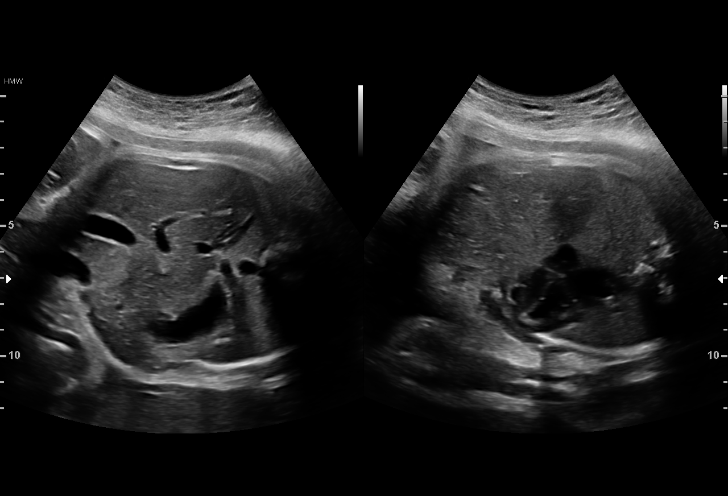
[im 21/31]
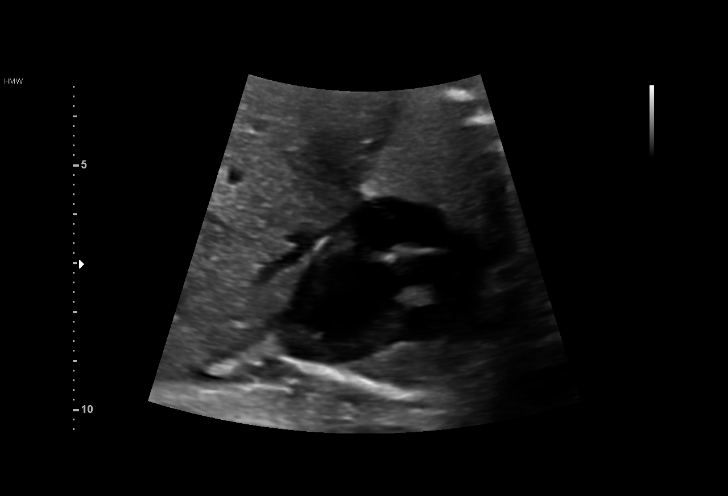
[im 23/31]
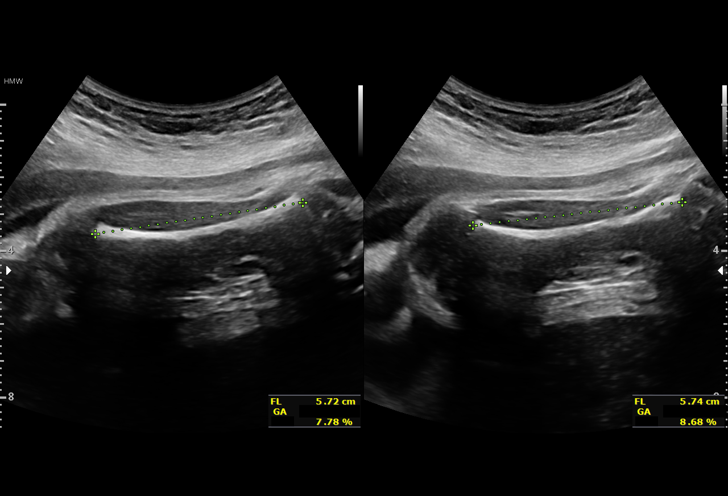
[im 25/31]
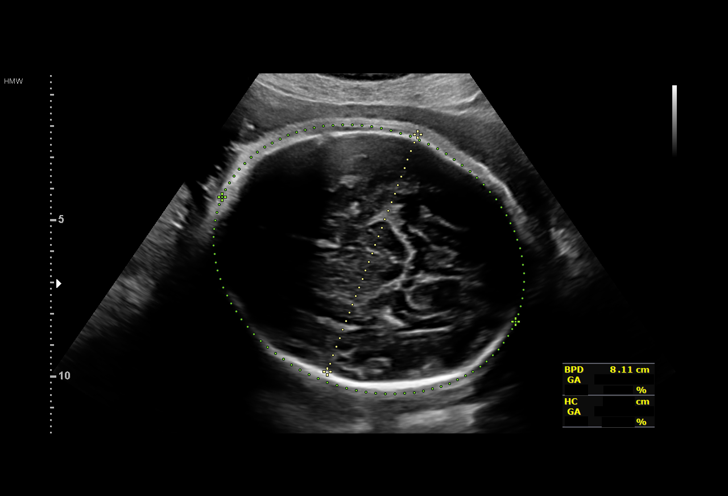
[im 27/31]
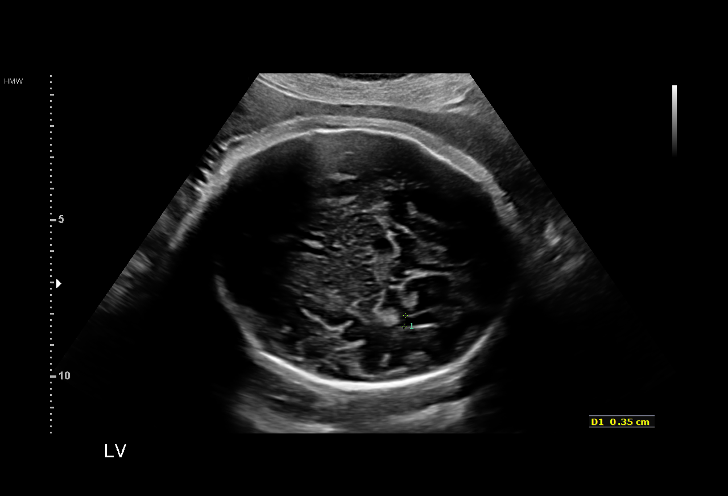
[im 29/31]
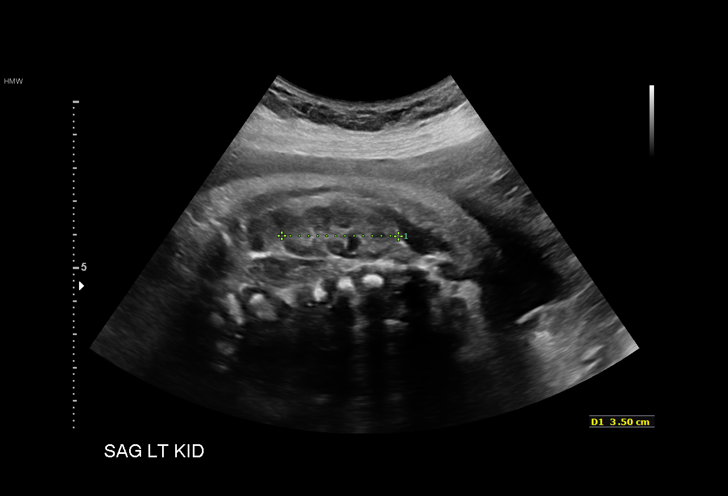

[13 of 28 positions shown; findings below may reference images not displayed]

OPERATIV[facility]

[facility] Clinic
[REDACTED]

1  CINDY MYINT            [id number]      [id number]     [id number]
Indications

31 weeks gestation of pregnancy
Advanced maternal age multigravida 35+,
third trimester, declined testing
Unspecified maternal hypertension, second
trimester
Pre-existing diabetes, type 2, in pregnancy,
third trimester on metformin and glyburide
OB History

Blood Type:            Height:  5'1"   Weight (lb):  150      BMI:
Gravidity:    5         Term:   3        Prem:   0        SAB:   1
TOP:          0       Ectopic:  0        Living: 3
Fetal Evaluation

Num Of Fetuses:     1
Fetal Heart         141
Rate(bpm):
Cardiac Activity:   Observed
Presentation:       Cephalic
Placenta:           Posterior, above cervical os
P. Cord Insertion:  Visualized, central
Amniotic Fluid
AFI FV:      Subjectively within normal limits

AFI Sum(cm)     %Tile       Largest Pocket(cm)
12.92           38

RUQ(cm)       RLQ(cm)       LUQ(cm)        LLQ(cm)
4.09          4.21          0
Biometry

BPD:      80.7  mm     G. Age:  32w 3d         70  %    CI:        76.23   %   70 - 86
FL/HC:      19.6   %   19.3 -
HC:      292.9  mm     G. Age:  32w 2d         37  %    HC/AC:      0.97       0.96 -
AC:      302.3  mm     G. Age:  34w 2d       > 97  %    FL/BPD:     71.0   %   71 - 87
FL:       57.3  mm     G. Age:  30w 0d          8  %    FL/AC:      19.0   %   20 - 24
HUM:      52.8  mm     G. Age:  30w 5d         38  %

Est. FW:    4841  gm      4 lb 7 oz     77  %
Gestational Age

LMP:           30w 5d       Date:   01/22/16                 EDD:   10/28/16
U/S Today:     32w 2d                                        EDD:   10/17/16
Best:          31w 3d    Det. By:   Previous Ultrasound      EDD:   10/23/16
(06/06/16)
Anatomy

Cranium:               Appears normal         Aortic Arch:            Previously seen
Cavum:                 Previously seen        Ductal Arch:            Previously seen
Ventricles:            Appears normal         Diaphragm:              Previously seen
Choroid Plexus:        Previously seen        Stomach:                Appears normal, left
sided
Cerebellum:            Previously seen        Abdomen:                Previously seen
Posterior Fossa:       Previously seen        Abdominal Wall:         Previously seen
Nuchal Fold:           Not applicable (>20    Cord Vessels:           Previously seen
wks GA)
Face:                  Orbits nl; profile not Kidneys:                Appear normal
well visualized
Lips:                  Previously seen        Bladder:                Appears normal
Thoracic:              Appears normal         Spine:                  Previously seen
Heart:                 Appears normal         Upper Extremities:      Previously seen
(4CH, axis, and situs
RVOT:                  Appears normal         Lower Extremities:      Previously seen
LVOT:                  Appears normal

Other:  Technically difficult due to advanced GA and fetal position.
Cervix Uterus Adnexa

Cervix
Not visualized (advanced GA >81wks)

Uterus
No abnormality visualized.

Left Ovary
No adnexal mass visualized.
Right Ovary
No adnexal mass visualized.

Cul De Sac:   No free fluid seen.

Adnexa:       No abnormality visualized.
Impression

Singleton intrauterine pregnancy at 31+3 with type 2 diabetes
Interval review of the anatomy shows no sonographic
markers for aneuploidy or structural anomalies
Amniotic fluid volume is normal
Estimated fetal weight is 2023g which is growth in the 77th
percentile
Recommendations

Repeat scan for growth in 4 weeks.

## 2019-04-30 ENCOUNTER — Encounter: Payer: Self-pay | Admitting: Radiology
